# Patient Record
Sex: Female | Born: 1970 | Race: Black or African American | Hispanic: No | Smoking: Never smoker
Health system: Southern US, Community
[De-identification: ages and names within clinical notes are randomized; demographics above are authoritative.]

## PROBLEM LIST (undated history)

## (undated) DIAGNOSIS — J45909 Unspecified asthma, uncomplicated: Secondary | ICD-10-CM

## (undated) DIAGNOSIS — D649 Anemia, unspecified: Secondary | ICD-10-CM

## (undated) DIAGNOSIS — F319 Bipolar disorder, unspecified: Secondary | ICD-10-CM

## (undated) DIAGNOSIS — F32A Depression, unspecified: Secondary | ICD-10-CM

## (undated) DIAGNOSIS — F329 Major depressive disorder, single episode, unspecified: Secondary | ICD-10-CM

## (undated) DIAGNOSIS — K589 Irritable bowel syndrome without diarrhea: Secondary | ICD-10-CM

## (undated) DIAGNOSIS — B1920 Unspecified viral hepatitis C without hepatic coma: Secondary | ICD-10-CM

## (undated) DIAGNOSIS — K7460 Unspecified cirrhosis of liver: Secondary | ICD-10-CM

## (undated) DIAGNOSIS — I10 Essential (primary) hypertension: Secondary | ICD-10-CM

## (undated) HISTORY — PX: ABDOMINAL SURGERY: SHX537

## (undated) HISTORY — PX: DILATION AND CURETTAGE OF UTERUS: SHX78

## (undated) HISTORY — PX: TUBAL LIGATION: SHX77

## (undated) HISTORY — PX: BREAST SURGERY: SHX581

---

## 1997-08-19 ENCOUNTER — Inpatient Hospital Stay (HOSPITAL_COMMUNITY): Admission: AD | Admit: 1997-08-19 | Discharge: 1997-08-19 | Payer: Self-pay | Admitting: Obstetrics & Gynecology

## 1997-08-27 ENCOUNTER — Inpatient Hospital Stay (HOSPITAL_COMMUNITY): Admission: AD | Admit: 1997-08-27 | Discharge: 1997-08-29 | Payer: Self-pay | Admitting: Obstetrics and Gynecology

## 1998-09-29 ENCOUNTER — Emergency Department (HOSPITAL_COMMUNITY): Admission: EM | Admit: 1998-09-29 | Discharge: 1998-09-29 | Payer: Self-pay | Admitting: Emergency Medicine

## 1998-09-29 ENCOUNTER — Encounter: Payer: Self-pay | Admitting: Emergency Medicine

## 2000-09-05 ENCOUNTER — Encounter: Payer: Self-pay | Admitting: Emergency Medicine

## 2000-09-05 ENCOUNTER — Emergency Department (HOSPITAL_COMMUNITY): Admission: EM | Admit: 2000-09-05 | Discharge: 2000-09-05 | Payer: Self-pay | Admitting: Emergency Medicine

## 2001-02-23 ENCOUNTER — Emergency Department (HOSPITAL_COMMUNITY): Admission: EM | Admit: 2001-02-23 | Discharge: 2001-02-23 | Payer: Self-pay | Admitting: *Deleted

## 2001-03-03 ENCOUNTER — Ambulatory Visit (HOSPITAL_BASED_OUTPATIENT_CLINIC_OR_DEPARTMENT_OTHER): Admission: RE | Admit: 2001-03-03 | Discharge: 2001-03-03 | Payer: Self-pay | Admitting: General Surgery

## 2001-03-09 ENCOUNTER — Inpatient Hospital Stay (HOSPITAL_COMMUNITY): Admission: EM | Admit: 2001-03-09 | Discharge: 2001-03-10 | Payer: Self-pay | Admitting: Emergency Medicine

## 2002-07-31 ENCOUNTER — Emergency Department (HOSPITAL_COMMUNITY): Admission: EM | Admit: 2002-07-31 | Discharge: 2002-07-31 | Payer: Self-pay

## 2002-10-02 ENCOUNTER — Encounter: Payer: Self-pay | Admitting: Endocrinology

## 2002-10-02 ENCOUNTER — Encounter (HOSPITAL_COMMUNITY): Admission: RE | Admit: 2002-10-02 | Discharge: 2002-12-31 | Payer: Self-pay | Admitting: Endocrinology

## 2005-01-23 ENCOUNTER — Emergency Department (HOSPITAL_COMMUNITY): Admission: EM | Admit: 2005-01-23 | Discharge: 2005-01-23 | Payer: Self-pay | Admitting: Emergency Medicine

## 2005-01-23 ENCOUNTER — Inpatient Hospital Stay (HOSPITAL_COMMUNITY): Admission: EM | Admit: 2005-01-23 | Discharge: 2005-01-24 | Payer: Self-pay | Admitting: Emergency Medicine

## 2007-07-01 ENCOUNTER — Encounter: Admission: RE | Admit: 2007-07-01 | Discharge: 2007-07-01 | Payer: Self-pay | Admitting: General Practice

## 2010-11-28 NOTE — Op Note (Signed)
[place]  Patient:    Nancy Mckinney, Nancy Mckinney Visit Number: [id number] MRN: [id number]          Service Type: DSU Location: [facility] Attending Physician:  Cherylynn Ridges Proc. Date: 03/03/01 Adm. Date:  03/03/2001                             Operative Report  PREOPERATIVE DIAGNOSIS:  Perianal pain with internal and external hemorrhoids.  POSTOPERATIVE DIAGNOSES: 1. Anterior anal fissure. 2. Anteroanal polyps. 3. Posterior and lateral internal and external hemorrhoids.  SURGEON:  Jimmye Norman, M.D.  PROCEDURES: 1. Rigid sigmoidoscopy. 2. Examination under anesthesia. 3. Lateral sphincterotomy with excision of fissure with anterior fissurectomy    and internal and external hemorrhoidectomy x 2.  ANESTHESIA:  General with a laryngeal airway.  ESTIMATED BLOOD LOSS:  15-20 cc.  COMPLICATIONS:  None.  CONDITION:  Stable.  INDICATION FOR OPERATION:  The patient is a 40 year old female with severe perianal pain associated with hemorrhoids.  She now comes in for exam under anesthesia, sigmoidoscopy, and hemorrhoidectomy.  FINDINGS:  The patient had an anteroanal fissure associated with sentinel polyps.  This area was excised, cauterized, and removed.  She also had internal and external hemorrhoids at approximately the 7 oclock position and also the 4 oclock position.  All of these were removed.  DESCRIPTION OF PROCEDURE:  The patient was taken to the operating room and placed on the table in a supine position.  After an adequate general anesthetic was administered with a laryngeal airway, she was prepped in the usual sterile manner after a rigid sigmoidoscopy was performed.  We performed the rigid sigmoidoscopy prior to Betadine prep.  We were able to go in up to a maximum of 22 at the base of the sigmoidoscope.  There were no rectal polyps noted, no masses, and no areas of bleeding.  We could see evidence of internal hemorrhoids on retrieval;  however, we could not visualize the anterior fissure until the procedure was completed and anoscope was inserted.  A bi-beveled anoscope was passed into the rectum after prepping, and we were able to see the anterior fissure.  We incorporated the base of this fissure up internally using a 3-0 chromic, then subsequently excised it down to the muscle and through the external portion of the external sphincter or the submucosal portion of the external sphincter.  We reapproximated using a running 3-0 chromic locking 3-0 chromic.  We subsequently identified two hemorrhoidal complexes at the 7 and 4 oclock position.  The base of the internal hemorrhoids were sutured using a 3-0 chromic and then subsequently used this running 3-0 chromic to reapproximate the mucosa after we excised the internal and external hemorrhoids using a #15 blade and electrocautery.  Some hemostatic stitches had to be used of 3-0 chromic after excision of both these complexes, but once we had completed there was no bleeding by any account. Once the hemorrhoids were removed, we were able to place a Gelfoam patch with Dibucaine ointment internally.  All needle counts, sponge counts, and instrument counts were correct.  The sterile dressing was applied.  The patient was taken to the recovery room. Attending Physician:  Cherylynn Ridges DD:  03/03/01 TD:  03/04/01 Job: [id number]

## 2014-03-17 ENCOUNTER — Emergency Department (HOSPITAL_COMMUNITY): Payer: Self-pay

## 2014-03-17 ENCOUNTER — Encounter (HOSPITAL_COMMUNITY): Payer: Self-pay | Admitting: Emergency Medicine

## 2014-03-17 ENCOUNTER — Emergency Department (HOSPITAL_COMMUNITY)
Admission: EM | Admit: 2014-03-17 | Discharge: 2014-03-21 | Disposition: A | Discharge status: Home / Self Care | Payer: Self-pay | Attending: Emergency Medicine | Admitting: Emergency Medicine

## 2014-03-17 DIAGNOSIS — Z008 Encounter for other general examination: Secondary | ICD-10-CM | POA: Insufficient documentation

## 2014-03-17 DIAGNOSIS — F411 Generalized anxiety disorder: Secondary | ICD-10-CM | POA: Insufficient documentation

## 2014-03-17 DIAGNOSIS — Z3202 Encounter for pregnancy test, result negative: Secondary | ICD-10-CM | POA: Insufficient documentation

## 2014-03-17 DIAGNOSIS — F319 Bipolar disorder, unspecified: Secondary | ICD-10-CM | POA: Insufficient documentation

## 2014-03-17 DIAGNOSIS — F332 Major depressive disorder, recurrent severe without psychotic features: Secondary | ICD-10-CM | POA: Insufficient documentation

## 2014-03-17 DIAGNOSIS — Z79899 Other long term (current) drug therapy: Secondary | ICD-10-CM | POA: Insufficient documentation

## 2014-03-17 DIAGNOSIS — F331 Major depressive disorder, recurrent, moderate: Secondary | ICD-10-CM | POA: Diagnosis present

## 2014-03-17 DIAGNOSIS — R45851 Suicidal ideations: Secondary | ICD-10-CM | POA: Insufficient documentation

## 2014-03-17 DIAGNOSIS — J45909 Unspecified asthma, uncomplicated: Secondary | ICD-10-CM | POA: Insufficient documentation

## 2014-03-17 HISTORY — DX: Unspecified asthma, uncomplicated: J45.909

## 2014-03-17 HISTORY — DX: Bipolar disorder, unspecified: F31.9

## 2014-03-17 LAB — COMPREHENSIVE METABOLIC PANEL
ALT: 112 U/L — AB (ref 0–35)
AST: 116 U/L — ABNORMAL HIGH (ref 0–37)
Albumin: 3.9 g/dL (ref 3.5–5.2)
Alkaline Phosphatase: 93 U/L (ref 39–117)
Anion gap: 20 — ABNORMAL HIGH (ref 5–15)
BUN: 9 mg/dL (ref 6–23)
CALCIUM: 9.4 mg/dL (ref 8.4–10.5)
CHLORIDE: 104 meq/L (ref 96–112)
CO2: 20 meq/L (ref 19–32)
Creatinine, Ser: 0.73 mg/dL (ref 0.50–1.10)
GFR calc Af Amer: >90 mL/min (ref >90)
GLUCOSE: 116 mg/dL — AB (ref 70–99)
Potassium: 3.9 mEq/L (ref 3.7–5.3)
SODIUM: 144 meq/L (ref 137–147)
Total Bilirubin: 0.3 mg/dL (ref 0.3–1.2)
Total Protein: 8.5 g/dL — ABNORMAL HIGH (ref 6.0–8.3)

## 2014-03-17 LAB — CBC
HCT: 39.9 % (ref 36.0–46.0)
HEMOGLOBIN: 13.7 g/dL (ref 12.0–15.0)
MCH: 30.7 pg (ref 26.0–34.0)
MCHC: 34.3 g/dL (ref 30.0–36.0)
MCV: 89.5 fL (ref 78.0–100.0)
PLATELETS: 136 10*3/uL — AB (ref 150–400)
RBC: 4.46 MIL/uL (ref 3.87–5.11)
RDW: 12.8 % (ref 11.5–15.5)
WBC: 5.4 10*3/uL (ref 4.0–10.5)

## 2014-03-17 LAB — RAPID URINE DRUG SCREEN, HOSP PERFORMED
AMPHETAMINES: NOT DETECTED
BENZODIAZEPINES: NOT DETECTED
Barbiturates: NOT DETECTED
Cocaine: NOT DETECTED
Opiates: NOT DETECTED
TETRAHYDROCANNABINOL: NOT DETECTED

## 2014-03-17 LAB — SALICYLATE LEVEL

## 2014-03-17 LAB — POC URINE PREG, ED: PREG TEST UR: NEGATIVE

## 2014-03-17 LAB — ACETAMINOPHEN LEVEL: Acetaminophen (Tylenol), Serum: <15 ug/mL (ref 10–30)

## 2014-03-17 LAB — ETHANOL: Alcohol, Ethyl (B): 148 mg/dL — ABNORMAL HIGH (ref 0–11)

## 2014-03-17 MED ORDER — ALBUTEROL SULFATE HFA 108 (90 BASE) MCG/ACT IN AERS
2.0000 | INHALATION_SPRAY | Freq: Four times a day (QID) | RESPIRATORY_TRACT | Status: DC | PRN
  Administered 2014-03-17 – 2014-03-20 (×5): 2 via RESPIRATORY_TRACT
  Filled 2014-03-17: qty 6.7

## 2014-03-17 MED ORDER — SERTRALINE HCL 50 MG PO TABS
50.0000 mg | ORAL_TABLET | Freq: Every day | ORAL | Status: DC
  Administered 2014-03-17 – 2014-03-20 (×4): 50 mg via ORAL
  Filled 2014-03-17 (×4): qty 1

## 2014-03-17 MED ORDER — LORAZEPAM 1 MG PO TABS
1.0000 mg | ORAL_TABLET | Freq: Three times a day (TID) | ORAL | Status: DC | PRN
  Administered 2014-03-18 – 2014-03-21 (×3): 1 mg via ORAL
  Filled 2014-03-17 (×3): qty 1

## 2014-03-17 MED ORDER — ACETAMINOPHEN 325 MG PO TABS
650.0000 mg | ORAL_TABLET | ORAL | Status: DC | PRN
  Administered 2014-03-18: 650 mg via ORAL
  Filled 2014-03-17: qty 2

## 2014-03-17 MED ORDER — ZOLPIDEM TARTRATE 5 MG PO TABS
5.0000 mg | ORAL_TABLET | Freq: Every evening | ORAL | Status: DC | PRN
  Administered 2014-03-17 – 2014-03-18 (×2): 5 mg via ORAL
  Filled 2014-03-17 (×2): qty 1

## 2014-03-17 MED ORDER — ONDANSETRON HCL 4 MG PO TABS: 4.0000 mg | ORAL_TABLET | Freq: Three times a day (TID) | ORAL | Status: DC | PRN

## 2014-03-17 NOTE — ED Notes (Signed)
Pt c/o severe depression and suicidal ideation. Sts was hospitalized last year for same, and is seeing psychiatrist but she is "not honest with them and plays a role" . Pt sts 2 years ago her son killed himself and ever since she "is not right in her head".  Pt sts she "predicted my son's death and that is so fucked up. If I didn't say it he would still be alive". Pt sts she has been drinking this entire week, denies drugs use, sts she is ashamed of herself and is disappointment to her family. Pt sts she is suicidal and she would hung herself "just like my son did". Pt is crying in triage, ex husband is at bedside and is supportive of pt.

## 2014-03-17 NOTE — ED Provider Notes (Signed)
CSN: [id number]     Arrival date & time 03/17/14  1231 History   First MD Initiated Contact with Patient 03/17/14 1302     Chief Complaint  Patient presents with  . Suicidal     (Consider location/radiation/quality/duration/timing/severity/associated sxs/prior Treatment) HPI Nancy Mckinney is a 43 y.o. female who presents to ED with complaint of suicidal ideations. States she is severely depressed, and has been so since the death of her son 1 year ago. Pt currently seeing psychiatrist and going to therapy but states she has not been honest with anyone and has been putting of a front of "strong [name]" she states but adds "in really i am very very weak and "fucked" up." States she has a lot going in her head. States she has been not taking all of her psych medications because could not afford some, and has been taking up to 60mg  of prozac a day instead of her 10mg  dose. States in the last 5 days she has not taken any of her medications and has been drinking daily. Denies drugs. States constantly thinking about hurting herself, thinks she wants to hang herself "like my son did." Pt had prior commitment for same and hospitalization 1 year ago. Pt is here voluntarily. States she wants to get help for herself and her family.   Past Medical History  Diagnosis Date  . Asthma   . Bipolar 1 disorder    History reviewed. No pertinent past surgical history. No family history on file. History  Substance Use Topics  . Smoking status: Never Smoker   . Smokeless tobacco: Not on file  . Alcohol Use: Yes   OB History   Grav Para Term Preterm Abortions TAB SAB Ect Mult Living                 Review of Systems  Constitutional: Negative for fever and chills.  Respiratory: Negative for cough, chest tightness and shortness of breath.   Cardiovascular: Negative for chest pain, palpitations and leg swelling.  Gastrointestinal: Negative for nausea, vomiting, abdominal pain and diarrhea.  Musculoskeletal:  Negative for arthralgias and myalgias.  Skin: Negative for rash.  Neurological: Negative for dizziness, weakness and headaches.  Psychiatric/Behavioral: Positive for suicidal ideas. Negative for self-injury. The patient is nervous/anxious.   All other systems reviewed and are negative.     Allergies  Review of patient's allergies indicates no known allergies.  Home Medications   Prior to Admission medications   Medication Sig Start Date End Date Taking? Authorizing Provider  albuterol (PROVENTIL HFA;VENTOLIN HFA) 108 (90 BASE) MCG/ACT inhaler Inhale 1 puff into the lungs every 6 (six) hours as needed for wheezing or shortness of breath.   Yes Historical Provider, MD  FLUoxetine (PROZAC) 10 MG capsule Take 10 mg by mouth daily.   Yes Historical Provider, MD  Prenatal Vit-Fe Fumarate-FA (MULTIVITAMIN-PRENATAL) 27-0.8 MG TABS tablet Take 1 tablet by mouth daily at 12 noon.   Yes Historical Provider, MD  QUEtiapine (SEROQUEL XR) 300 MG 24 hr tablet Take 300 mg by mouth daily.   Yes Historical Provider, MD   BP 139/83  Pulse 98  Temp(Src) 98.7 F (37.1 C) (Oral)  Resp 18  Ht 5\' 5"  (1.651 m)  Wt 225 lb (102.059 kg)  BMI 37.44 kg/m2  SpO2 98%  LMP 03/03/2014 Physical Exam  Nursing note and vitals reviewed. Constitutional: She is oriented to person, place, and time. She appears well-developed and well-nourished. No distress.  HENT:  Head: Normocephalic.  Eyes: Conjunctivae  are normal.  Neck: Neck supple.  Cardiovascular: Normal rate, regular rhythm and normal heart sounds.   Pulmonary/Chest: Effort normal and breath sounds normal. No respiratory distress. She has no wheezes. She has no rales.  Abdominal: Soft. Bowel sounds are normal. She exhibits no distension. There is no tenderness. There is no rebound.  Musculoskeletal: She exhibits no edema.  Neurological: She is alert and oriented to person, place, and time.  Skin: Skin is warm and dry.  Psychiatric: Her speech is normal and  behavior is normal. Judgment normal. Her mood appears anxious. Cognition and memory are normal. She exhibits a depressed mood. She expresses suicidal ideation. She expresses suicidal plans.  Pt is very tearful through out entire history    ED Course  Procedures (including critical care time) Labs Review Labs Reviewed  CBC - Abnormal; Notable for the following:    Platelets 136 (*)    All other components within normal limits  COMPREHENSIVE METABOLIC PANEL - Abnormal; Notable for the following:    Glucose, Bld 116 (*)    Total Protein 8.5 (*)    AST 116 (*)    ALT 112 (*)    Anion gap 20 (*)    All other components within normal limits  ETHANOL - Abnormal; Notable for the following:    Alcohol, Ethyl (B) 148 (*)    All other components within normal limits  SALICYLATE LEVEL - Abnormal; Notable for the following:    Salicylate Lvl <1.5 (*)    All other components within normal limits  ACETAMINOPHEN LEVEL  URINE RAPID DRUG SCREEN (HOSP PERFORMED)  POC URINE PREG, ED    Imaging Review No results found.   EKG Interpretation None      MDM   Final diagnoses:  Major depressive disorder, recurrent, severe without psychotic features  Suicidal ideations    Pt with depression. Thoughts of SI with plan to hang herself. Will medically clear. Admit to psych. Pt is voluntarily here.   Filed Vitals:   03/18/14 2121 03/19/14 0551 03/19/14 1039 03/19/14 1257  BP: 159/86 126/73 159/65 136/90  Pulse: 66 73 70 66  Temp: 99.2 F (37.3 C) 98.2 F (36.8 C) 97.7 F (36.5 C) 99.1 F (37.3 C)  TempSrc: Oral Oral Oral Oral  Resp: 18 18 16 17   Height:      Weight:      SpO2: 97% 99% 100% 98%       Renold Genta, PA-C 03/19/14 1639

## 2014-03-17 NOTE — ED Notes (Signed)
Up to the bathroom 

## 2014-03-17 NOTE — ED Notes (Signed)
Pt to x ray via stretcher, mHt w/ pt

## 2014-03-17 NOTE — ED Notes (Signed)
Report given to [facility] in psych ED

## 2014-03-17 NOTE — Progress Notes (Signed)
The following facilities have been contacted regarding inptx bed availability:  REFERRAL FAXED: [facility]- per Amy low acuity adult beds [facility]- per [facility] can fax [facility]- per Lisabeth Pick can fax info [facility]- per Nicole Kindred having d/c's and can fax Sharlene Motts- per Roselie Awkward can fax [facility]- per Kenney Houseman can fax [facility]- per [facility] beds available  AT CAPACITY: [facility]- per Danny Lawless- per Vivi Ferns- per Rebecka Apley- per Geraldine Contras- per Evelena Peat [facility]- per [facility]- per Caryl Pina [facility]- per Kasandra Knudsen currently at [facility] Disposition [facility]

## 2014-03-17 NOTE — Consult Note (Signed)
[facility] Face-to-Face Psychiatry Consult   Reason for Consult:  Depression Referring Physician:  EDP  Nancy Mckinney is an 43 y.o. female. Total Time spent with patient: 20 minutes  Assessment: AXIS I:  Major Depression, Recurrent severe AXIS II:  Deferred AXIS III:   Past Medical History  Diagnosis Date  . Asthma   . Bipolar 1 disorder    AXIS IV:  economic problems, occupational problems, other psychosocial or environmental problems, problems related to social environment and problems with primary support group AXIS V:  21-30 behavior considerably influenced by delusions or hallucinations OR serious impairment in judgment, communication OR inability to function in almost all areas  Plan:  Recommend psychiatric Inpatient admission when medically cleared.  Subjective:   Nancy Mckinney is a 43 y.o. female patient admitted with depression, suicidal ideations and plan to hang herself.  HPI:  Patient is tearful, depressed with a plan to hang herself like her son did two years ago.  He was 14 yo and had been living with her.  She realized something was wrong with him but the place she took him cleared him.  Nancy Mckinney sent him to Nancy Mckinney with his father and his relatives to see if they could help him.  She went to visit and he kept saying he wanted to kill himself but she had discounted it.  He hung himself with tied baby blankets from his newborn son.  His brother found him and a note from him saying he was worthless.  Nancy Mckinney has huge guilt issues.  After his death, her husband divorced her and she lost her job.  For the past two years she has been trying to cope with no positive results.  She was going to Nancy Mckinney but states she was not open about how bad she was feeling.  Nancy Mckinney was prescribed Prozac 10 mg daily and Seroquel 300 mg at bedtime but stopped taking them a month ago, too expensive. Denies homicidal ideations, hallucinations, and drug abuse.  She has been drinking more this week but does not  feel it is a problem. HPI Elements:   Location:  generalized. Quality:  acute. Severity:  severe. Timing:  constant. Duration:  past month. Context:  stressors.  Past Psychiatric History: Past Medical History  Diagnosis Date  . Asthma   . Bipolar 1 disorder     reports that she has never smoked. She does not have any smokeless tobacco history on file. She reports that she drinks alcohol. She reports that she uses illicit drugs. History reviewed. No pertinent family history.         Allergies:  No Known Allergies  ACT Assessment Complete:  Yes:    Educational Status    Risk to Self: Risk to self with the past 6 months Is patient at risk for suicide?: Yes Substance abuse history and/or treatment for substance abuse?: Yes  Risk to Others:    Abuse:    Prior Inpatient Therapy:    Prior Outpatient Therapy:    Additional Information:                    Objective: Blood pressure 139/83, pulse 98, temperature 98.7 F (37.1 C), temperature source Oral, resp. rate 18, height _0  (1.651 m), weight 225 lb (102.059 kg), last menstrual period 03/03/2014, SpO2 98.00%.Body mass index is 37.44 kg/(m^2). Results for orders placed during the hospital encounter of 03/17/14 (from the past 72 hour(s))  URINE RAPID DRUG SCREEN (HOSP PERFORMED)     Status:  None   Collection Time    03/17/14  1:04 PM      Result Value Ref Range   Opiates NONE DETECTED  NONE DETECTED   Cocaine NONE DETECTED  NONE DETECTED   Benzodiazepines NONE DETECTED  NONE DETECTED   Amphetamines NONE DETECTED  NONE DETECTED   Tetrahydrocannabinol NONE DETECTED  NONE DETECTED   Barbiturates NONE DETECTED  NONE DETECTED   Comment:            DRUG SCREEN FOR MEDICAL PURPOSES     ONLY.  IF CONFIRMATION IS NEEDED     FOR ANY PURPOSE, NOTIFY LAB     WITHIN 5 DAYS.                LOWEST DETECTABLE LIMITS     FOR URINE DRUG SCREEN     Drug Class       Cutoff (ng/mL)     Amphetamine      1000      Barbiturate      200     Benzodiazepine   017     Tricyclics       494     Opiates          300     Cocaine          300     THC              50  ACETAMINOPHEN LEVEL     Status: None   Collection Time    03/17/14  1:07 PM      Result Value Ref Range   Acetaminophen (Tylenol), Serum <15.0  10 - 30 ug/mL   Comment:            THERAPEUTIC CONCENTRATIONS VARY     SIGNIFICANTLY. A RANGE OF 10-30     ug/mL MAY BE AN EFFECTIVE     CONCENTRATION FOR MANY PATIENTS.     HOWEVER, SOME ARE BEST TREATED     AT CONCENTRATIONS OUTSIDE THIS     RANGE.     ACETAMINOPHEN CONCENTRATIONS     >150 ug/mL AT 4 HOURS AFTER     INGESTION AND >50 ug/mL AT 12     HOURS AFTER INGESTION ARE     OFTEN ASSOCIATED WITH TOXIC     REACTIONS.  CBC     Status: Abnormal   Collection Time    03/17/14  1:07 PM      Result Value Ref Range   WBC 5.4  4.0 - 10.5 K/uL   RBC 4.46  3.87 - 5.11 MIL/uL   Hemoglobin 13.7  12.0 - 15.0 g/dL   HCT 39.9  36.0 - 46.0 %   MCV 89.5  78.0 - 100.0 fL   MCH 30.7  26.0 - 34.0 pg   MCHC 34.3  30.0 - 36.0 g/dL   RDW 12.8  11.5 - 15.5 %   Platelets 136 (*) 150 - 400 K/uL  COMPREHENSIVE METABOLIC PANEL     Status: Abnormal   Collection Time    03/17/14  1:07 PM      Result Value Ref Range   Sodium 144  137 - 147 mEq/L   Potassium 3.9  3.7 - 5.3 mEq/L   Chloride 104  96 - 112 mEq/L   CO2 20  19 - 32 mEq/L   Glucose, Bld 116 (*) 70 - 99 mg/dL   BUN 9  6 - 23 mg/dL   Creatinine, Ser 0.73  0.50 -  1.10 mg/dL   Calcium 9.4  8.4 - 10.5 mg/dL   Total Protein 8.5 (*) 6.0 - 8.3 g/dL   Albumin 3.9  3.5 - 5.2 g/dL   AST 116 (*) 0 - 37 U/L   ALT 112 (*) 0 - 35 U/L   Alkaline Phosphatase 93  39 - 117 U/L   Total Bilirubin 0.3  0.3 - 1.2 mg/dL   GFR calc non Af Amer >90  >90 mL/min   GFR calc Af Amer >90  >90 mL/min   Comment: (NOTE)     The eGFR has been calculated using the CKD EPI equation.     This calculation has not been validated in all clinical situations.     eGFR's  persistently <90 mL/min signify possible Chronic Kidney     Disease.   Anion gap 20 (*) 5 - 15  ETHANOL     Status: Abnormal   Collection Time    03/17/14  1:07 PM      Result Value Ref Range   Alcohol, Ethyl (B) 148 (*) 0 - 11 mg/dL   Comment:            LOWEST DETECTABLE LIMIT FOR     SERUM ALCOHOL IS 11 mg/dL     FOR MEDICAL PURPOSES ONLY  SALICYLATE LEVEL     Status: Abnormal   Collection Time    03/17/14  1:07 PM      Result Value Ref Range   Salicylate Lvl <1.6 (*) 2.8 - 20.0 mg/dL  POC URINE PREG, ED     Status: None   Collection Time    03/17/14  1:08 PM      Result Value Ref Range   Preg Test, Ur NEGATIVE  NEGATIVE   Comment:            THE SENSITIVITY OF THIS     METHODOLOGY IS >24 mIU/mL   Labs are reviewed and are pertinent for no medical issues.  Current Facility-Administered Medications  Medication Dose Route Frequency Provider Last Rate Last Dose  . acetaminophen (TYLENOL) tablet 650 mg  650 mg Oral Q4H PRN Tatyana A Kirichenko, PA-C      . LORazepam (ATIVAN) tablet 1 mg  1 mg Oral Q8H PRN Tatyana A Kirichenko, PA-C      . ondansetron (ZOFRAN) tablet 4 mg  4 mg Oral Q8H PRN Tatyana A Kirichenko, PA-C      . zolpidem (AMBIEN) tablet 5 mg  5 mg Oral QHS PRN Tatyana A Kirichenko, PA-C       Current Outpatient Prescriptions  Medication Sig Dispense Refill  . albuterol (PROVENTIL HFA;VENTOLIN HFA) 108 (90 BASE) MCG/ACT inhaler Inhale 1 puff into the lungs every 6 (six) hours as needed for wheezing or shortness of breath.      Marland Kitchen FLUoxetine (PROZAC) 10 MG capsule Take 10 mg by mouth daily.      . Prenatal Vit-Fe Fumarate-FA (MULTIVITAMIN-PRENATAL) 27-0.8 MG TABS tablet Take 1 tablet by mouth daily at 12 noon.      . QUEtiapine (SEROQUEL XR) 300 MG 24 hr tablet Take 300 mg by mouth daily.        Psychiatric Specialty Exam:     Blood pressure 139/83, pulse 98, temperature 98.7 F (37.1 C), temperature source Oral, resp. rate 18, height _0  (1.651 m), weight 225  lb (102.059 kg), last menstrual period 03/03/2014, SpO2 98.00%.Body mass index is 37.44 kg/(m^2).  General Appearance: Casual  Eye Contact::  Fair  Speech:  Normal  Rate  Volume:  Normal  Mood:  Anxious and Depressed  Affect:  Depressed  Thought Process:  Coherent  Orientation:  Full (Time, Place, and Person)  Thought Content:  Rumination  Suicidal Thoughts:  Yes.  with intent/plan  Homicidal Thoughts:  No  Memory:  Immediate;   Good Recent;   Good Remote;   Good  Judgement:  Impaired  Insight:  Fair  Psychomotor Activity:  Decreased  Concentration:  Fair  Recall:  [employer] of Knowledge:Fair  Language: Fair  Akathisia:  No  Handed:  Right  AIMS (if indicated):     Assets:  [place] Support  Sleep:      Musculoskeletal: Strength & Muscle Tone: within normal limits Gait & Station: normal Patient leans: N/A  Treatment Plan Summary: Daily contact with patient to assess and evaluate symptoms and progress in treatment Medication management; admit to inpatient psychiatry for stabilization  Waylan Boga, [place] 03/17/2014 2:14 PM  Pt seen and I agree with treatment and plan Levonne Spiller MD

## 2014-03-17 NOTE — ED Notes (Signed)
Dan Europe NP into see

## 2014-03-17 NOTE — ED Notes (Signed)
Pt's daughter Butch Penny took ALL of pt's belongings home (clothes, jewelry: earings, rings, bracelets). Pt's purse given to RN in the [facility]

## 2014-03-17 NOTE — ED Notes (Signed)
Back from xray

## 2014-03-17 NOTE — ED Notes (Signed)
PT ambulatory from triage, Theodoro Clock NP into see on arrival

## 2014-03-17 NOTE — ED Notes (Signed)
Waiting for pt to arrive from triage

## 2014-03-17 NOTE — ED Notes (Signed)
Pt. Requesting albuterol inhaler.

## 2014-03-17 NOTE — ED Notes (Signed)
Report received from [facility]. Pt. Alert and oriented in no distress denies SI, HI, AVH and pain. Will continue to monitor for safety. Pt. Instructed to come to me with problems or concerns. Q 15 minute checks continue.

## 2014-03-18 NOTE — Consult Note (Signed)
[facility] Face-to-Face Psychiatry Consult   Reason for Consult:  Depression Referring Physician:  EDP  Nancy Mckinney is an 43 y.o. female. Total Time spent with patient: 20 minutes  Assessment: AXIS I:  Major Depression, Recurrent severe AXIS II:  Deferred AXIS III:   Past Medical History  Diagnosis Date  . Asthma   . Bipolar 1 disorder    AXIS IV:  economic problems, occupational problems, other psychosocial or environmental problems, problems related to social environment and problems with primary support group AXIS V:  21-30 behavior considerably influenced by delusions or hallucinations OR serious impairment in judgment, communication OR inability to function in almost all areas  Plan:  Recommend psychiatric Inpatient admission when medically cleared.  Subjective:   Nancy Mckinney is a 43 y.o. female patient admitted with depression, suicidal ideations and plan to hang herself.  HPI:  Patient is tearful, depressed with a plan to hang herself like her son did two years ago.  He was 50 yo and had been living with her.  She realized something was wrong with him but the place she took him cleared him.  Nancy Mckinney sent him to [facility] with his father and his relatives to see if they could help him.  She went to visit and he kept saying he wanted to kill himself but she had discounted it.  He hung himself with tied baby blankets from his newborn son.  His brother found him and a note from him saying he was worthless.  Nancy Mckinney has huge guilt issues.  After his death, her husband divorced her and she lost her job.  For the past two years she has been trying to cope with no positive results.  She was going to [facility] but states she was not open about how bad she was feeling.  Nancy Mckinney was prescribed Prozac 10 mg daily and Seroquel 300 mg at bedtime but stopped taking them a month ago, too expensive. Denies homicidal ideations, hallucinations, and drug abuse.  She has been drinking more this week but does not  feel it is a problem.  Patient seen again today, 03/18/14. She is still depressed with plan to harm herself. Reiterates numerous losses--son, divorce, loss of job, homeless. Her only coping mechanism has been drinking. She is very hopeless and in need of inpatient treatment. HPI Elements:   Location:  generalized. Quality:  acute. Severity:  severe. Timing:  constant. Duration:  past month. Context:  stressors.  Past Psychiatric History: Past Medical History  Diagnosis Date  . Asthma   . Bipolar 1 disorder     reports that she has never smoked. She does not have any smokeless tobacco history on file. She reports that she drinks alcohol. She reports that she uses illicit drugs. History reviewed. No pertinent family history.         Allergies:  No Known Allergies  ACT Assessment Complete:  Yes:    Educational Status    Risk to Self: Risk to self with the past 6 months Is patient at risk for suicide?: Yes Substance abuse history and/or treatment for substance abuse?: No  Risk to Others:    Abuse:    Prior Inpatient Therapy:    Prior Outpatient Therapy:    Additional Information:                    Objective: Blood pressure 134/82, pulse 65, temperature 98.3 F (36.8 C), temperature source Oral, resp. rate 16, height 5' 5" (1.651 m), weight 225 lb (  102.059 kg), last menstrual period 03/03/2014, SpO2 98.00%.Body mass index is 37.44 kg/(m^2). Results for orders placed during the hospital encounter of 03/17/14 (from the past 72 hour(s))  URINE RAPID DRUG SCREEN (HOSP PERFORMED)     Status: None   Collection Time    03/17/14  1:04 PM      Result Value Ref Range   Opiates NONE DETECTED  NONE DETECTED   Cocaine NONE DETECTED  NONE DETECTED   Benzodiazepines NONE DETECTED  NONE DETECTED   Amphetamines NONE DETECTED  NONE DETECTED   Tetrahydrocannabinol NONE DETECTED  NONE DETECTED   Barbiturates NONE DETECTED  NONE DETECTED   Comment:            DRUG SCREEN FOR  MEDICAL PURPOSES     ONLY.  IF CONFIRMATION IS NEEDED     FOR ANY PURPOSE, NOTIFY LAB     WITHIN 5 DAYS.                LOWEST DETECTABLE LIMITS     FOR URINE DRUG SCREEN     Drug Class       Cutoff (ng/mL)     Amphetamine      1000     Barbiturate      200     Benzodiazepine   315     Tricyclics       400     Opiates          300     Cocaine          300     THC              50  ACETAMINOPHEN LEVEL     Status: None   Collection Time    03/17/14  1:07 PM      Result Value Ref Range   Acetaminophen (Tylenol), Serum <15.0  10 - 30 ug/mL   Comment:            THERAPEUTIC CONCENTRATIONS VARY     SIGNIFICANTLY. A RANGE OF 10-30     ug/mL MAY BE AN EFFECTIVE     CONCENTRATION FOR MANY PATIENTS.     HOWEVER, SOME ARE BEST TREATED     AT CONCENTRATIONS OUTSIDE THIS     RANGE.     ACETAMINOPHEN CONCENTRATIONS     >150 ug/mL AT 4 HOURS AFTER     INGESTION AND >50 ug/mL AT 12     HOURS AFTER INGESTION ARE     OFTEN ASSOCIATED WITH TOXIC     REACTIONS.  CBC     Status: Abnormal   Collection Time    03/17/14  1:07 PM      Result Value Ref Range   WBC 5.4  4.0 - 10.5 K/uL   RBC 4.46  3.87 - 5.11 MIL/uL   Hemoglobin 13.7  12.0 - 15.0 g/dL   HCT 39.9  36.0 - 46.0 %   MCV 89.5  78.0 - 100.0 fL   MCH 30.7  26.0 - 34.0 pg   MCHC 34.3  30.0 - 36.0 g/dL   RDW 12.8  11.5 - 15.5 %   Platelets 136 (*) 150 - 400 K/uL  COMPREHENSIVE METABOLIC PANEL     Status: Abnormal   Collection Time    03/17/14  1:07 PM      Result Value Ref Range   Sodium 144  137 - 147 mEq/L   Potassium 3.9  3.7 - 5.3 mEq/L   Chloride 104  96 - 112 mEq/L   CO2 20  19 - 32 mEq/L   Glucose, Bld 116 (*) 70 - 99 mg/dL   BUN 9  6 - 23 mg/dL   Creatinine, Ser 0.73  0.50 - 1.10 mg/dL   Calcium 9.4  8.4 - 10.5 mg/dL   Total Protein 8.5 (*) 6.0 - 8.3 g/dL   Albumin 3.9  3.5 - 5.2 g/dL   AST 116 (*) 0 - 37 U/L   ALT 112 (*) 0 - 35 U/L   Alkaline Phosphatase 93  39 - 117 U/L   Total Bilirubin 0.3  0.3 - 1.2 mg/dL    GFR calc non Af Amer >90  >90 mL/min   GFR calc Af Amer >90  >90 mL/min   Comment: (NOTE)     The eGFR has been calculated using the CKD EPI equation.     This calculation has not been validated in all clinical situations.     eGFR's persistently <90 mL/min signify possible Chronic Kidney     Disease.   Anion gap 20 (*) 5 - 15  ETHANOL     Status: Abnormal   Collection Time    03/17/14  1:07 PM      Result Value Ref Range   Alcohol, Ethyl (B) 148 (*) 0 - 11 mg/dL   Comment:            LOWEST DETECTABLE LIMIT FOR     SERUM ALCOHOL IS 11 mg/dL     FOR MEDICAL PURPOSES ONLY  SALICYLATE LEVEL     Status: Abnormal   Collection Time    03/17/14  1:07 PM      Result Value Ref Range   Salicylate Lvl <8.2 (*) 2.8 - 20.0 mg/dL  POC URINE PREG, ED     Status: None   Collection Time    03/17/14  1:08 PM      Result Value Ref Range   Preg Test, Ur NEGATIVE  NEGATIVE   Comment:            THE SENSITIVITY OF THIS     METHODOLOGY IS >24 mIU/mL   Labs are reviewed and are pertinent for no medical issues.  Current Facility-Administered Medications  Medication Dose Route Frequency Provider Last Rate Last Dose  . acetaminophen (TYLENOL) tablet 650 mg  650 mg Oral Q4H PRN Tatyana A Kirichenko, PA-C      . albuterol (PROVENTIL HFA;VENTOLIN HFA) 108 (90 BASE) MCG/ACT inhaler 2 puff  2 puff Inhalation Q6H PRN Johnna Acosta, MD   2 puff at 03/18/14 1019  . LORazepam (ATIVAN) tablet 1 mg  1 mg Oral Q8H PRN Tatyana A Kirichenko, PA-C      . ondansetron (ZOFRAN) tablet 4 mg  4 mg Oral Q8H PRN Tatyana A Kirichenko, PA-C      . sertraline (ZOLOFT) tablet 50 mg  50 mg Oral Daily Waylan Boga, NP   50 mg at 03/18/14 1014  . zolpidem (AMBIEN) tablet 5 mg  5 mg Oral QHS PRN Tatyana A Kirichenko, PA-C   5 mg at 03/17/14 2107   Current Outpatient Prescriptions  Medication Sig Dispense Refill  . albuterol (PROVENTIL HFA;VENTOLIN HFA) 108 (90 BASE) MCG/ACT inhaler Inhale 1 puff into the lungs every 6 (six)  hours as needed for wheezing or shortness of breath.      Marland Kitchen FLUoxetine (PROZAC) 10 MG capsule Take 10 mg by mouth daily.      . Prenatal Vit-Fe Fumarate-FA (MULTIVITAMIN-PRENATAL) 27-0.8 MG  TABS tablet Take 1 tablet by mouth daily at 12 noon.      . QUEtiapine (SEROQUEL XR) 300 MG 24 hr tablet Take 300 mg by mouth daily.        Psychiatric Specialty Exam:     Blood pressure 134/82, pulse 65, temperature 98.3 F (36.8 C), temperature source Oral, resp. rate 16, height 5' 5" (1.651 m), weight 225 lb (102.059 kg), last menstrual period 03/03/2014, SpO2 98.00%.Body mass index is 37.44 kg/(m^2).  General Appearance: Casual  Eye Contact::  Fair  Speech:  Normal Rate  Volume:  Normal  Mood:  Anxious and Depressed  Affect:  Depressed  Thought Process:  Coherent  Orientation:  Full (Time, Place, and Person)  Thought Content:  Rumination  Suicidal Thoughts:  Yes.  with intent/plan  Homicidal Thoughts:  No  Memory:  Immediate;   Good Recent;   Good Remote;   Good  Judgement:  Impaired  Insight:  Fair  Psychomotor Activity:  Decreased  Concentration:  Fair  Recall:  [employer] of Knowledge:Fair  Language: Fair  Akathisia:  No  Handed:  Right  AIMS (if indicated):     Assets:  Housing Physical Health Resilience Social Support  Sleep:      Musculoskeletal: Strength & Muscle Tone: within normal limits Gait & Station: normal Patient leans: N/A  Treatment Plan Summary: Daily contact with patient to assess and evaluate symptoms and progress in treatment Medication management; admit to inpatient psychiatry for stabilization  Levonne Spiller, MD 03/18/2014 11:38 AM

## 2014-03-18 NOTE — ED Notes (Signed)
On the phone 

## 2014-03-18 NOTE — Progress Notes (Signed)
Follow-up calls have been placed to the following facilities regarding inptx: [facility]- per Shirlean Mylar at [place] no answer [facility]- per Lisabeth Pick being reviewed by MD and will call once complete Sharl[facility] Motts- per Roselie Awkward will review and call TTS once review complete [facility]- per Kenney Houseman at [place]- per [place] at [place]  The following facilities have been contacted and are at capacity: [facility]- per Lindaann Slough- per June Le[facility]- per Vivi Ferns- per Cornelia Copa- per Jetty Peeks [facility]- per Keturah Shavers- per [facility]- per Drucie Ip- per Manuela Schwartz [facility]- per Rande Lawman- per [facility]- per [facility]  *pt has been declined at [facility] per [facility] Disposition MHT

## 2014-03-18 NOTE — ED Notes (Signed)
Patient presents depressed with blunted affect; denies any current thoughts of self harm but does endorse hopelessness and racing thoughts; denies HI/AVH. NAD

## 2014-03-18 NOTE — ED Notes (Signed)
Dr Harrington Challenger and shuvon NP into see

## 2014-03-18 NOTE — Progress Notes (Signed)
1410- received phone call from Aldine at [facility], states that pt has been added to their wait list and will call once bed becomes available.   Rick Duff Disposition [facility]

## 2014-03-18 NOTE — ED Notes (Signed)
Up to the bathroom 

## 2014-03-18 NOTE — ED Notes (Signed)
Pt c/o wheezing--BS w/ bilateral insp and exp wheezing all fields. Immediate relief w/ albuterol MDI-all fields CTA

## 2014-03-18 NOTE — BH Assessment (Signed)
Nancy Mckinney, [facility] at [facility], confirmed adult unit is currently at capacity. Contacted the following facilities for placement:  CLINICAL INFORMATION HAS BEEN FAXED AND IS UNDER REVIEW: [facility], per [facility], per [name]  AT CAPACITY: [name], per [name], per [name], per [name] per [name], per [name], per [name], per [name], per [name], per [name], per [name], per [name], per [employer], per [name], per [name]  MULTIPLE ATTEMPTS TO CONTACT WITH NO RESPONSE: [name]   [name], [place], [name] [phone]

## 2014-03-19 ENCOUNTER — Encounter (HOSPITAL_COMMUNITY): Payer: Self-pay | Admitting: Psychiatry

## 2014-03-19 MED ORDER — QUETIAPINE FUMARATE 300 MG PO TABS
300.0000 mg | ORAL_TABLET | Freq: Every day | ORAL | Status: DC
  Administered 2014-03-19 – 2014-03-20 (×2): 300 mg via ORAL
  Filled 2014-03-19 (×2): qty 1

## 2014-03-19 NOTE — ED Notes (Signed)
Report received from [facility]. Pt. Sleeping, respirations even and unlabored. Will continue to monitor for safety. Q 15 minute checks continue.

## 2014-03-19 NOTE — Consult Note (Signed)
[facility] Face-to-Face Psychiatry Consult   Reason for Consult:  Depression Referring Physician:  EDP  Nancy Mckinney is an 43 y.o. female. Total Time spent with patient: 20 minutes  Assessment: AXIS I:  Major Depression, Recurrent severe AXIS II:  Deferred AXIS III:   Past Medical History  Diagnosis Date  . Asthma   . Bipolar 1 disorder    AXIS IV:  economic problems, occupational problems, other psychosocial or environmental problems, problems related to social environment and problems with primary support group AXIS V:  21-30 behavior considerably influenced by delusions or hallucinations OR serious impairment in judgment, communication OR inability to function in almost all areas  Plan:  Recommend psychiatric Inpatient admission when medically cleared.  Dr. Darleene Cleaver assessed the patient and concurs with the plan.  Subjective:   Nancy Mckinney is a 43 y.o. female patient admitted with depression, suicidal ideations and plan to hang herself.  HPI:  Patient remains depressed with suicidal ideations.  She has not been able to sleep, even with the Ambien.  Nancy Mckinney.  Medication restarted. HPI Elements:   Location:  generalized. Quality:  acute. Severity:  severe. Timing:  constant. Duration:  past month. Context:  stressors.  Past Psychiatric History: Past Medical History  Diagnosis Date  . Asthma   . Bipolar 1 disorder     reports that she has never smoked. She does not have any smokeless tobacco history on file. She reports that she drinks alcohol. She reports that she uses illicit drugs. History reviewed. No pertinent family history.         Allergies:  No Known Allergies  ACT Assessment Complete:  Yes:    Educational Status    Risk to Self: Risk to self with the past 6 months Is patient at risk for suicide?: Yes Substance abuse history and/or treatment for substance  abuse?: No  Risk to Others:    Abuse:    Prior Inpatient Therapy:    Prior Outpatient Therapy:    Additional Information:                    Objective: Blood pressure 126/73, pulse 73, temperature 98.2 F (36.8 C), temperature source Oral, resp. rate 18, height $RemoveBe'5\' 5"'[id number]$  (1.651 m), weight 225 lb (102.059 kg), last menstrual period 03/03/2014, SpO2 99.00%.Body mass index is 37.44 kg/(m^2). Results for orders placed during the hospital encounter of 03/17/14 (from the past 72 hour(s))  URINE RAPID DRUG SCREEN (HOSP PERFORMED)     Status: None   Collection Time    03/17/14  1:04 PM      Result Value Ref Range   Opiates NONE DETECTED  NONE DETECTED   Cocaine NONE DETECTED  NONE DETECTED   Benzodiazepines NONE DETECTED  NONE DETECTED   Amphetamines NONE DETECTED  NONE DETECTED   Tetrahydrocannabinol NONE DETECTED  NONE DETECTED   Barbiturates NONE DETECTED  NONE DETECTED   Comment:            DRUG SCREEN FOR MEDICAL PURPOSES     ONLY.  IF CONFIRMATION IS NEEDED     FOR ANY PURPOSE, NOTIFY LAB     WITHIN 5 DAYS.                LOWEST DETECTABLE LIMITS     FOR URINE DRUG SCREEN     Drug Class       Cutoff (ng/mL)  Amphetamine      1000     Barbiturate      200     Benzodiazepine   952     Tricyclics       841     Opiates          300     Cocaine          300     THC              50  ACETAMINOPHEN LEVEL     Status: None   Collection Time    03/17/14  1:07 PM      Result Value Ref Range   Acetaminophen (Tylenol), Serum <15.0  10 - 30 ug/mL   Comment:            THERAPEUTIC CONCENTRATIONS VARY     SIGNIFICANTLY. A RANGE OF 10-30     ug/mL MAY BE AN EFFECTIVE     CONCENTRATION FOR MANY PATIENTS.     HOWEVER, SOME ARE BEST TREATED     AT CONCENTRATIONS OUTSIDE THIS     RANGE.     ACETAMINOPHEN CONCENTRATIONS     >150 ug/mL AT 4 HOURS AFTER     INGESTION AND >50 ug/mL AT 12     HOURS AFTER INGESTION ARE     OFTEN ASSOCIATED WITH TOXIC     REACTIONS.  CBC      Status: Abnormal   Collection Time    03/17/14  1:07 PM      Result Value Ref Range   WBC 5.4  4.0 - 10.5 K/uL   RBC 4.46  3.87 - 5.11 MIL/uL   Hemoglobin 13.7  12.0 - 15.0 g/dL   HCT 39.9  36.0 - 46.0 %   MCV 89.5  78.0 - 100.0 fL   MCH 30.7  26.0 - 34.0 pg   MCHC 34.3  30.0 - 36.0 g/dL   RDW 12.8  11.5 - 15.5 %   Platelets 136 (*) 150 - 400 K/uL  COMPREHENSIVE METABOLIC PANEL     Status: Abnormal   Collection Time    03/17/14  1:07 PM      Result Value Ref Range   Sodium 144  137 - 147 mEq/L   Potassium 3.9  3.7 - 5.3 mEq/L   Chloride 104  96 - 112 mEq/L   CO2 20  19 - 32 mEq/L   Glucose, Bld 116 (*) 70 - 99 mg/dL   BUN 9  6 - 23 mg/dL   Creatinine, Ser 0.73  0.50 - 1.10 mg/dL   Calcium 9.4  8.4 - 10.5 mg/dL   Total Protein 8.5 (*) 6.0 - 8.3 g/dL   Albumin 3.9  3.5 - 5.2 g/dL   AST 116 (*) 0 - 37 U/L   ALT 112 (*) 0 - 35 U/L   Alkaline Phosphatase 93  39 - 117 U/L   Total Bilirubin 0.3  0.3 - 1.2 mg/dL   GFR calc non Af Amer >90  >90 mL/min   GFR calc Af Amer >90  >90 mL/min   Comment: (NOTE)     The eGFR has been calculated using the CKD EPI equation.     This calculation has not been validated in all clinical situations.     eGFR's persistently <90 mL/min signify possible Chronic Kidney     Disease.   Anion gap 20 (*) 5 - 15  ETHANOL     Status: Abnormal   Collection  Time    03/17/14  1:07 PM      Result Value Ref Range   Alcohol, Ethyl (B) 148 (*) 0 - 11 mg/dL   Comment:            LOWEST DETECTABLE LIMIT FOR     SERUM ALCOHOL IS 11 mg/dL     FOR MEDICAL PURPOSES ONLY  SALICYLATE LEVEL     Status: Abnormal   Collection Time    03/17/14  1:07 PM      Result Value Ref Range   Salicylate Lvl <4.0 (*) 2.8 - 20.0 mg/dL  POC URINE PREG, ED     Status: None   Collection Time    03/17/14  1:08 PM      Result Value Ref Range   Preg Test, Ur NEGATIVE  NEGATIVE   Comment:            THE SENSITIVITY OF THIS     METHODOLOGY IS >24 mIU/mL   Labs are reviewed and  are pertinent for no medical issues.  Current Facility-Administered Medications  Medication Dose Route Frequency Provider Last Rate Last Dose  . acetaminophen (TYLENOL) tablet 650 mg  650 mg Oral Q4H PRN Tatyana A Kirichenko, PA-C   650 mg at 03/18/14 1222  . albuterol (PROVENTIL HFA;VENTOLIN HFA) 108 (90 BASE) MCG/ACT inhaler 2 puff  2 puff Inhalation Q6H PRN Johnna Acosta, MD   2 puff at 03/18/14 1625  . LORazepam (ATIVAN) tablet 1 mg  1 mg Oral Q8H PRN Tatyana A Kirichenko, PA-C   1 mg at 03/18/14 2117  . ondansetron (ZOFRAN) tablet 4 mg  4 mg Oral Q8H PRN Tatyana A Kirichenko, PA-C      . sertraline (ZOLOFT) tablet 50 mg  50 mg Oral Daily Waylan Boga, NP   50 mg at 03/18/14 1014  . zolpidem (AMBIEN) tablet 5 mg  5 mg Oral QHS PRN Tatyana A Kirichenko, PA-C   5 mg at 03/18/14 2117   Current Outpatient Prescriptions  Medication Sig Dispense Refill  . albuterol (PROVENTIL HFA;VENTOLIN HFA) 108 (90 BASE) MCG/ACT inhaler Inhale 1 puff into the lungs every 6 (six) hours as needed for wheezing or shortness of breath.      Marland Kitchen FLUoxetine (PROZAC) 10 MG capsule Take 10 mg by mouth daily.      . Prenatal Vit-Fe Fumarate-FA (MULTIVITAMIN-PRENATAL) 27-0.8 MG TABS tablet Take 1 tablet by mouth daily at 12 noon.      . QUEtiapine (SEROQUEL XR) 300 MG 24 hr tablet Take 300 mg by mouth daily.        Psychiatric Specialty Exam:     Blood pressure 126/73, pulse 73, temperature 98.2 F (36.8 C), temperature source Oral, resp. rate 18, height $RemoveBe'5\' 5"'FFfiMQWZo$  (1.651 m), weight 225 lb (102.059 kg), last menstrual period 03/03/2014, SpO2 99.00%.Body mass index is 37.44 kg/(m^2).  General Appearance: Casual  Eye Contact::  Fair  Speech:  Normal Rate  Volume:  Normal  Mood:  Anxious and Depressed  Affect:  Depressed  Thought Process:  Coherent  Orientation:  Full (Time, Place, and Person)  Thought Content:  Rumination  Suicidal Thoughts:  Yes.  with intent/plan  Homicidal Thoughts:  No  Memory:  Immediate;    Good Recent;   Good Remote;   Good  Judgement:  Impaired  Insight:  Fair  Psychomotor Activity:  Decreased  Concentration:  Fair  Recall:  [employer] of Knowledge:Fair  Language: Fair  Akathisia:  No  Handed:  Right  AIMS (if indicated):     Assets:  Housing Physical Health Resilience Social Support  Sleep:      Musculoskeletal: Strength & Muscle Tone: within normal limits Gait & Station: normal Patient leans: N/A  Treatment Plan Summary: Daily contact with patient to assess and evaluate symptoms and progress in treatment Medication management; admit to inpatient psychiatry for stabilization; Seroquel 300 mg at bedtime for sleep and mood stability--this has helped the patient in the past  Waylan Boga, [place] 03/19/2014 8:32 AM   Patient seen, evaluated and I agree with notes by Nurse Practitioner. Corena Pilgrim, MD

## 2014-03-19 NOTE — ED Notes (Signed)
Patient denies SI, HI, AVH at present. Reports racing thoughts. States feelings of anxiety 8/10, depression 10/10. Contracts for safety on the unit. Reports taking Seroquel at home at 1900.   Encouragement offered. Given Seroquel.   Q 15 safety checks continue.

## 2014-03-19 NOTE — Progress Notes (Signed)
Writer faxed referrals to the following facilities with open beds: [facility], per [facility] fax [employer], per [place] [phone] Fax [facility], per Abigail Butts [phone] Fax [facility], per [facility] [address] per [place] [phone] Fax [facility], per [facility]    The following facilities are at capacity: [employer], per [facility], [facility], per [facility], per [facility], per [facility], per [facility], per [facility], per [facility], [facility], per [facility], per Linus Orn    No answer [facility]

## 2014-03-19 NOTE — Progress Notes (Signed)
  CARE MANAGEMENT ED NOTE 03/19/2014  Patient:  [facility]   Account Number:  [id number]  Date Initiated:  03/19/2014  Documentation initiated by:  Jackelyn Poling  Subjective/Objective Assessment:   43 yr old self pay [place] pt c/o severe depression and SI, son killed himself 2 yrs ago " dx Major Depression, Recurrent severe     Subjective/Objective Assessment Detail:   no pcp listed for pt & she confirms no pcp  Pt tells Cm she lost her "son and husband and I don't have a place to live"  Pt pleasant and cooperative  last [facility] admission was at [facility] on 01/23/2005  RN notes indicates pt has a daughter Butch Penny on 03/17/14     Action/Plan:   ED CM spoke with pt about [facility], IRC, urban ministries, DSS, Family services of the [place], discounted [place] providers  see note below   Action/Plan Detail:   Anticipated DC Date:       Status Recommendation to Physician:   Result of Recommendation:    Other ED [place]  Other  PCP issues  Outpatient Services - Pt will follow up  [facility] / [facility] (established/new)    Choice offered to / List presented to:            Status of service:  Completed, signed off  ED Comments:   ED Comments Detail:  CM spoke with pt who confirms self pay [employer] resident with no pcp. CM discussed and provided written information for self pay pcps, importance of pcp for f/u care, www.needymeds.org, discounted pharmacies and other [employer] such as [employer], affordable care act,  financial assistance, DSS and  health department Reviewed resources for [employer] self pay pcps like Jinny Blossom, family medicine at [address], [facility] family practice, general medical clinics, [facility] urgent care plus others, medication resources, [facility] out patient pharmacies and housing Pt voiced understanding and appreciation of resources provided  Provided [facility] contact information

## 2014-03-19 NOTE — BH Assessment (Signed)
Per Lavell Luster, [employer] at [facility], adult unit is currently at capacity. Contacted the following facilities for placement:  CURRENTLY AT CAPACITY: [facility], per [facility], per Wandra Feinstein, per [facility], per [facility], per Evert Kohl, per [facility], per [facility], Per [facility], per [facility], per [facility], per [facility], per [facility], per [facility], per Gilberto Better, per [facility], per [place]  INFORMATION HAS BEEN SENT TO THESE FACILITIES FOR WAIT LIST: [place] [facility]  PT DECLINED: [facility]   [place], [place], [facility] Triage Specialist [phone]

## 2014-03-20 MED ORDER — SERTRALINE HCL 50 MG PO TABS
100.0000 mg | ORAL_TABLET | Freq: Every day | ORAL | Status: DC
  Administered 2014-03-21: 100 mg via ORAL
  Filled 2014-03-20: qty 2

## 2014-03-20 NOTE — Consult Note (Signed)
[facility] Face-to-Face Psychiatry Consult   Reason for Consult:  Depression, passive suicidal thoughts Referring Physician:  EDP  Nancy Mckinney is an 43 y.o. female. Total Time spent with patient: 20 minutes  Assessment: AXIS I:  Major Depression, Recurrent severe AXIS II:  Deferred AXIS III:   Past Medical History  Diagnosis Date  . Asthma   . Bipolar 1 disorder    AXIS IV:  economic problems, occupational problems, other psychosocial or environmental problems, problems related to social environment and problems with primary support group AXIS V:  21-30 behavior considerably influenced by delusions or hallucinations OR serious impairment in judgment, communication OR inability to function in almost all areas  Plan:  Recommend psychiatric Inpatient admission when medically cleared. Patient is  assessed and qualifies for admission.  Subjective:   Nancy Mckinney is a 43 y.o. female patient admitted with depression, suicidal ideations and plan to hang herself.  HPI:  Patient remains depressed , anxious and suicidal ideations.   She has rated her anxiety as 8/10 and depression as 10/10. She slept just a little better on Seroquel last night. Patient continues to report negative thoughts and passive suicidal thoughts. She denies homicidal ideation or auditory/visual hallucinations.   HPI Elements:   Location:  Depression, anxiety and suicidal thoughts Quality:  acute. Severity:  severe. Timing:  constant. Duration:  past month. Context:  stressors.  Past Psychiatric History: Past Medical History  Diagnosis Date  . Asthma   . Bipolar 1 disorder     reports that she has never smoked. She does not have any smokeless tobacco history on file. She reports that she drinks alcohol. She reports that she uses illicit drugs. History reviewed. No pertinent family history.         Allergies:  No Known Allergies  ACT Assessment Complete:  Yes:    Educational Status    Risk to Self: Risk  to self with the past 6 months Is patient at risk for suicide?: Yes Substance abuse history and/or treatment for substance abuse?: No  Risk to Others:    Abuse:    Prior Inpatient Therapy:    Prior Outpatient Therapy:    Additional Information:                    Objective: Blood pressure 120/71, pulse 68, temperature 98 F (36.7 C), temperature source Oral, resp. rate 18, height $RemoveBe'5\' 5"'[id number]$  (1.651 m), weight 102.059 kg (225 lb), last menstrual period 03/03/2014, SpO2 100.00%.Body mass index is 37.44 kg/(m^2). Results for orders placed during the hospital encounter of 03/17/14 (from the past 72 hour(s))  URINE RAPID DRUG SCREEN (HOSP PERFORMED)     Status: None   Collection Time    03/17/14  1:04 PM      Result Value Ref Range   Opiates NONE DETECTED  NONE DETECTED   Cocaine NONE DETECTED  NONE DETECTED   Benzodiazepines NONE DETECTED  NONE DETECTED   Amphetamines NONE DETECTED  NONE DETECTED   Tetrahydrocannabinol NONE DETECTED  NONE DETECTED   Barbiturates NONE DETECTED  NONE DETECTED   Comment:            DRUG SCREEN FOR MEDICAL PURPOSES     ONLY.  IF CONFIRMATION IS NEEDED     FOR ANY PURPOSE, NOTIFY LAB     WITHIN 5 DAYS.                LOWEST DETECTABLE LIMITS     FOR URINE DRUG SCREEN  Drug Class       Cutoff (ng/mL)     Amphetamine      1000     Barbiturate      200     Benzodiazepine   250     Tricyclics       539     Opiates          300     Cocaine          300     THC              50  ACETAMINOPHEN LEVEL     Status: None   Collection Time    03/17/14  1:07 PM      Result Value Ref Range   Acetaminophen (Tylenol), Serum <15.0  10 - 30 ug/mL   Comment:            THERAPEUTIC CONCENTRATIONS VARY     SIGNIFICANTLY. A RANGE OF 10-30     ug/mL MAY BE AN EFFECTIVE     CONCENTRATION FOR MANY PATIENTS.     HOWEVER, SOME ARE BEST TREATED     AT CONCENTRATIONS OUTSIDE THIS     RANGE.     ACETAMINOPHEN CONCENTRATIONS     >150 ug/mL AT 4 HOURS AFTER      INGESTION AND >50 ug/mL AT 12     HOURS AFTER INGESTION ARE     OFTEN ASSOCIATED WITH TOXIC     REACTIONS.  CBC     Status: Abnormal   Collection Time    03/17/14  1:07 PM      Result Value Ref Range   WBC 5.4  4.0 - 10.5 K/uL   RBC 4.46  3.87 - 5.11 MIL/uL   Hemoglobin 13.7  12.0 - 15.0 g/dL   HCT 39.9  36.0 - 46.0 %   MCV 89.5  78.0 - 100.0 fL   MCH 30.7  26.0 - 34.0 pg   MCHC 34.3  30.0 - 36.0 g/dL   RDW 12.8  11.5 - 15.5 %   Platelets 136 (*) 150 - 400 K/uL  COMPREHENSIVE METABOLIC PANEL     Status: Abnormal   Collection Time    03/17/14  1:07 PM      Result Value Ref Range   Sodium 144  137 - 147 mEq/L   Potassium 3.9  3.7 - 5.3 mEq/L   Chloride 104  96 - 112 mEq/L   CO2 20  19 - 32 mEq/L   Glucose, Bld 116 (*) 70 - 99 mg/dL   BUN 9  6 - 23 mg/dL   Creatinine, Ser 0.73  0.50 - 1.10 mg/dL   Calcium 9.4  8.4 - 10.5 mg/dL   Total Protein 8.5 (*) 6.0 - 8.3 g/dL   Albumin 3.9  3.5 - 5.2 g/dL   AST 116 (*) 0 - 37 U/L   ALT 112 (*) 0 - 35 U/L   Alkaline Phosphatase 93  39 - 117 U/L   Total Bilirubin 0.3  0.3 - 1.2 mg/dL   GFR calc non Af Amer >90  >90 mL/min   GFR calc Af Amer >90  >90 mL/min   Comment: (NOTE)     The eGFR has been calculated using the CKD EPI equation.     This calculation has not been validated in all clinical situations.     eGFR's persistently <90 mL/min signify possible Chronic Kidney     Disease.   Anion gap 20 (*)  5 - 15  ETHANOL     Status: Abnormal   Collection Time    03/17/14  1:07 PM      Result Value Ref Range   Alcohol, Ethyl (B) 148 (*) 0 - 11 mg/dL   Comment:            LOWEST DETECTABLE LIMIT FOR     SERUM ALCOHOL IS 11 mg/dL     FOR MEDICAL PURPOSES ONLY  SALICYLATE LEVEL     Status: Abnormal   Collection Time    03/17/14  1:07 PM      Result Value Ref Range   Salicylate Lvl <8.2 (*) 2.8 - 20.0 mg/dL  POC URINE PREG, ED     Status: None   Collection Time    03/17/14  1:08 PM      Result Value Ref Range   Preg Test, Ur  NEGATIVE  NEGATIVE   Comment:            THE SENSITIVITY OF THIS     METHODOLOGY IS >24 mIU/mL   Labs are reviewed and are pertinent for no medical issues.  Current Facility-Administered Medications  Medication Dose Route Frequency Provider Last Rate Last Dose  . acetaminophen (TYLENOL) tablet 650 mg  650 mg Oral Q4H PRN Tatyana A Kirichenko, PA-C   650 mg at 03/18/14 1222  . albuterol (PROVENTIL HFA;VENTOLIN HFA) 108 (90 BASE) MCG/ACT inhaler 2 puff  2 puff Inhalation Q6H PRN Johnna Acosta, MD   2 puff at 03/19/14 2143  . LORazepam (ATIVAN) tablet 1 mg  1 mg Oral Q8H PRN Tatyana A Kirichenko, PA-C   1 mg at 03/18/14 2117  . ondansetron (ZOFRAN) tablet 4 mg  4 mg Oral Q8H PRN Tatyana A Kirichenko, PA-C      . QUEtiapine (SEROQUEL) tablet 300 mg  300 mg Oral QHS Waylan Boga, NP   300 mg at 03/19/14 1948  . sertraline (ZOLOFT) tablet 50 mg  50 mg Oral Daily Waylan Boga, NP   50 mg at 03/20/14 8003   Current Outpatient Prescriptions  Medication Sig Dispense Refill  . albuterol (PROVENTIL HFA;VENTOLIN HFA) 108 (90 BASE) MCG/ACT inhaler Inhale 1 puff into the lungs every 6 (six) hours as needed for wheezing or shortness of breath.      Marland Kitchen FLUoxetine (PROZAC) 10 MG capsule Take 10 mg by mouth daily.      . Prenatal Vit-Fe Fumarate-FA (MULTIVITAMIN-PRENATAL) 27-0.8 MG TABS tablet Take 1 tablet by mouth daily at 12 noon.      . QUEtiapine (SEROQUEL XR) 300 MG 24 hr tablet Take 300 mg by mouth daily.        Psychiatric Specialty Exam:     Blood pressure 120/71, pulse 68, temperature 98 F (36.7 C), temperature source Oral, resp. rate 18, height $RemoveBe'5\' 5"'tGDAGELQx$  (1.651 m), weight 102.059 kg (225 lb), last menstrual period 03/03/2014, SpO2 100.00%.Body mass index is 37.44 kg/(m^2).  General Appearance: Casual  Eye Contact::  Fair  Speech:  Normal Rate  Volume:  Normal  Mood:  Anxious and Depressed  Affect:  Depressed  Thought Process:  Coherent  Orientation:  Full (Time, Place, and Person)  Thought  Content:  Rumination  Suicidal Thoughts:  Yes.  with intent/plan  Homicidal Thoughts:  No  Memory:  Immediate;   Good Recent;   Good Remote;   Good  Judgement:  Impaired  Insight:  Fair  Psychomotor Activity:  Decreased  Concentration:  Fair  Recall:  [employer] of  Knowledge:Fair  Language: Fair  Akathisia:  No  Handed:  Right  AIMS (if indicated):     Assets:  Housing Physical Health Resilience Social Support  Sleep:      Musculoskeletal: Strength & Muscle Tone: within normal limits Gait & Station: normal Patient leans: N/A  Treatment Plan Summary: Daily contact with patient to assess and evaluate symptoms and progress in treatment Medication management;  Patient awaiting admission  to inpatient psychiatry for stabilization; Continue  Seroquel 300 mg at bedtime for sleep and mood stability--this has helped the patient in the past Increase Zoloft to $RemoveB'100mg'meyLgVmY$  daily for anxiety and depression.  Corena Pilgrim, MD 03/20/2014 9:14 AM

## 2014-03-20 NOTE — BHH Counselor (Signed)
Per Roderic Palau at [facility], they have beds. Writer faxed referral.  Arnold Long, [place] Assessment Counselor

## 2014-03-20 NOTE — ED Provider Notes (Signed)
Medical screening examination/treatment/procedure(s) were performed by non-physician practitioner and as supervising physician I was immediately available for consultation/collaboration.  Ernestina Patches, MD 03/20/14 1018

## 2014-03-20 NOTE — ED Notes (Signed)
Pt sleeping at present, will monitor for safety.

## 2014-03-20 NOTE — ED Notes (Signed)
Patient anxious, rates anxiety 10/10. States that her thoughts are racing. Endorses SI, "If I wasn't here I would try to do something, but there's nothing here I can do".   Encouragement offered. Given Ativan, Seroquel.  Q 15 safety checks continue.

## 2014-03-21 DIAGNOSIS — F411 Generalized anxiety disorder: Secondary | ICD-10-CM

## 2014-03-21 DIAGNOSIS — F331 Major depressive disorder, recurrent, moderate: Secondary | ICD-10-CM | POA: Diagnosis present

## 2014-03-21 MED ORDER — QUETIAPINE FUMARATE 300 MG PO TABS: 300.0000 mg | ORAL_TABLET | Freq: Every day | ORAL | Status: DC

## 2014-03-21 MED ORDER — SERTRALINE HCL 100 MG PO TABS: 100.0000 mg | ORAL_TABLET | Freq: Every day | ORAL | Status: DC

## 2014-03-21 NOTE — Consult Note (Signed)
[facility] Face-to-Face Psychiatry Consult   Reason for Consult: Increased anxiety  Referring Physician:  EDP  Nancy Mckinney is an 43 y.o. female. Total Time spent with patient: 20 minutes  Assessment: AXIS I:  Major Depression, Recurrent severe               Anxiety disorder, NOS AXIS II:  Deferred AXIS III:   Past Medical History  Diagnosis Date  . Asthma    AXIS IV:  economic problems, occupational problems, other psychosocial or environmental problems, problems related to social environment and problems with primary support group AXIS V:  61-70 mild symptoms  Plan:  No evidence of imminent risk to self or others at present.   Patient does not meet criteria for psychiatric inpatient admission. Discussed crisis plan, support from social network, calling 911, coming to the Emergency Department, and calling Suicide Hotline. Patient will be referred to [facility] for medication management and IRC  due to being homeless Patient is  assessed and qualifies for admission.  Subjective:   Nancy Mckinney is a 43 y.o. female patient admitted with depression but now reporting increasing anxiety.  HPI:  Patient seen and chart reviewed. She reports decreased depressive symptoms, but increased anxiety and worries due to being homeless. She denies psychosis, delusions, suicidal/homicidal ideations, intent or plan. She also reports decreased racing thoughts and states that she has been sleeping much better since she started taking Seroquel at bedtime.    HPI Elements:   Location:  Depression, anxiety  Quality:  mild Timing:  variable Duration:  past month. Context:  stressors.  Past Psychiatric History: Past Medical History  Diagnosis Date  . Asthma     reports that she has never smoked. She does not have any smokeless tobacco history on file. She reports that she drinks alcohol. She reports that she uses illicit drugs. History reviewed. No pertinent family history.         Allergies:  No  Known Allergies  ACT Assessment Complete:  Yes:    Educational Status    Risk to Self: Risk to self with the past 6 months Is patient at risk for suicide?: No Substance abuse history and/or treatment for substance abuse?: No  Risk to Others:    Abuse:    Prior Inpatient Therapy:    Prior Outpatient Therapy:    Additional Information:               Objective: Blood pressure 127/74, pulse 86, temperature 98 F (36.7 C), temperature source Oral, resp. rate 16, height 5\' 5"  (1.651 m), weight 102.059 kg (225 lb), last menstrual period 03/03/2014, SpO2 100.00%.Body mass index is 37.44 kg/(m^2). No results found for this or any previous visit (from the past 72 hour(s)). Labs are reviewed and are pertinent for no medical issues.  Current Facility-Administered Medications  Medication Dose Route Frequency Provider Last Rate Last Dose  . acetaminophen (TYLENOL) tablet 650 mg  650 mg Oral Q4H PRN Tatyana A Kirichenko, PA-C   650 mg at 03/18/14 1222  . albuterol (PROVENTIL HFA;VENTOLIN HFA) 108 (90 BASE) MCG/ACT inhaler 2 puff  2 puff Inhalation Q6H PRN Johnna Acosta, MD   2 puff at 03/20/14 1920  . LORazepam (ATIVAN) tablet 1 mg  1 mg Oral Q8H PRN Tatyana A Kirichenko, PA-C   1 mg at 03/21/14 0914  . ondansetron (ZOFRAN) tablet 4 mg  4 mg Oral Q8H PRN Tatyana A Kirichenko, PA-C      . QUEtiapine (SEROQUEL) tablet 300  mg  300 mg Oral QHS Waylan Boga, NP   300 mg at 03/20/14 1920  . sertraline (ZOLOFT) tablet 100 mg  100 mg Oral Daily Lidia Clavijo   100 mg at 03/21/14 5750   Current Outpatient Prescriptions  Medication Sig Dispense Refill  . albuterol (PROVENTIL HFA;VENTOLIN HFA) 108 (90 BASE) MCG/ACT inhaler Inhale 1 puff into the lungs every 6 (six) hours as needed for wheezing or shortness of breath.      Marland Kitchen FLUoxetine (PROZAC) 10 MG capsule Take 10 mg by mouth daily.      . Prenatal Vit-Fe Fumarate-FA (MULTIVITAMIN-PRENATAL) 27-0.8 MG TABS tablet Take 1 tablet by mouth daily at 12  noon.      . QUEtiapine (SEROQUEL XR) 300 MG 24 hr tablet Take 300 mg by mouth daily.        Psychiatric Specialty Exam:     Blood pressure 127/74, pulse 86, temperature 98 F (36.7 C), temperature source Oral, resp. rate 16, height 5\' 5"  (1.651 m), weight 102.059 kg (225 lb), last menstrual period 03/03/2014, SpO2 100.00%.Body mass index is 37.44 kg/(m^2).  General Appearance: fairly groomed  Engineer, water::  Fair  Speech:  Normal Rate  Volume:  Normal  Mood:  Anxious   Affect:  Neutral  Thought Process:  Coherent  Orientation:  Full (Time, Place, and Person)  Thought Content:  Rumination  Suicidal Thoughts:  denies  Homicidal Thoughts:  No  Memory:  Immediate;   Good Recent;   Good Remote;   Good  Judgement:  Impaired  Insight:  Fair  Psychomotor Activity: normal  Concentration:  Fair  Recall:  [employer] of Knowledge:Fair  Language: Fair  Akathisia:  No  Handed:  Right  AIMS (if indicated):     Assets:  Housing Physical Health Resilience Social Support  Sleep:    good   Musculoskeletal: Strength & Muscle Tone: within normal limits Gait & Station: normal Patient leans: N/A  Treatment Plan Summary: Daily contact with patient to assess and evaluate symptoms and progress in treatment Medication management;  Patient no longer meet criteria for admission; She will be discharged to [facility] for outpatient medication management and referred to [facility] due to being homelss. Continue  Seroquel 300 mg at bedtime for sleep and mood stability--this has helped the patient in the past Continue Zoloft to 100mg  daily for anxiety and depression.  Corena Pilgrim, MD 03/21/2014 9:39 AM

## 2014-03-21 NOTE — BHH Suicide Risk Assessment (Signed)
   Demographic Factors:  Low socioeconomic status, Unemployed and homeless  Total Time spent with patient: 20 minutes  Psychiatric Specialty Exam: Physical Exam  Psychiatric: Her speech is normal and behavior is normal. Judgment and thought content normal. Her mood appears anxious. Cognition and memory are normal.    Review of Systems  Constitutional: Negative.   HENT: Negative.   Eyes: Negative.   Respiratory: Negative.   Cardiovascular: Negative.   Gastrointestinal: Negative.   Genitourinary: Negative.   Musculoskeletal: Negative.   Skin: Negative.   Neurological: Negative.   Endo/Heme/Allergies: Negative.   Psychiatric/Behavioral: Negative.     Blood pressure 102/66, pulse 100, temperature 97.9 F (36.6 C), temperature source Oral, resp. rate 12, height 5\' 5"  (1.651 m), weight 102.059 kg (225 lb), last menstrual period 03/03/2014, SpO2 96.00%.Body mass index is 37.44 kg/(m^2).  General Appearance: Casual  Eye Contact::  Minimal  Speech:  Clear and Coherent  Volume:  Normal  Mood:  Euthymic  Affect:  anxious  Thought Process:  Goal Directed  Orientation:  Full (Time, Place, and Person)  Thought Content:  Negative  Suicidal Thoughts:  No  Homicidal Thoughts:  No  Memory:  Immediate;   Fair Recent;   Fair Remote;   Fair  Judgement:  Fair  Insight:  Fair  Psychomotor Activity:  Normal  Concentration:  Fair  Recall:  [employer] of [place]  Language: Fair  Akathisia:  No  Handed:  Right  AIMS (if indicated):     Assets:  Communication Skills Physical Health  Sleep:   good    Musculoskeletal: Strength & Muscle Tone: within normal limits Gait & Station: normal Patient leans: N/A   Mental Status Per Nursing Assessment::   On Admission:     Current Mental Status by Physician: patient denies suicidal ideation, intent or plan  Loss Factors: Financial problems/change in socioeconomic status  Historical Factors: NA  Risk Reduction Factors:   Sense  of responsibility to family  Continued Clinical Symptoms:  Resolving anxiety and depressive symptoms  Cognitive Features That Contribute To Risk:  Closed-mindedness    Suicide Risk:  Minimal: No identifiable suicidal ideation.  Patients presenting with no risk factors but with morbid ruminations; may be classified as minimal risk based on the severity of the depressive symptoms  Discharge Diagnoses:   AXIS I:  Major Depression, Recurrent severe AXIS II:  Deferred AXIS III:   Past Medical History  Diagnosis Date  . Asthma   . Bipolar 1 disorder    AXIS IV:  other psychosocial or environmental problems and problems related to social environment AXIS V:  61-70 mild symptoms  Plan Of Care/Follow-up recommendations:  Activity:  as tolerated Diet:  healthy  Is patient on multiple antipsychotic therapies at discharge:  No   Has Patient had three or more failed trials of antipsychotic monotherapy by history:  No  Recommended Plan for Multiple Antipsychotic Therapies: NA    Corena Pilgrim, MD 03/21/2014, 10:57 AM

## 2014-03-21 NOTE — ED Notes (Signed)
Pt provided with psychoeducational materials and resources.

## 2014-03-21 NOTE — Discharge Instructions (Addendum)
Trastorno Tour manager (Major Depressive Disorder) El trastorno depresivo mayor es una enfermedad mental. Tambin se llamado depresin clnica o depresin unipolar. Produce sentimientos de tristeza, desesperanza o desamparo. Algunas personas con trastorno depresivo mayor no se sienten particularmente tristes, pero pierden Librarian, academic las cosas que solan disfrutar (anhedonia). Tambin puede causar sntomas fsicos. Interfiere en el trabajo, la escuela, las relaciones y otras actividades diarias normales. Puede variar en gravedad, pero es ms duradera y ms grave que la tristeza que todos sentimos de vez en cuando en nuestras vidas.  Muchas veces es desencadenada por sucesos estresantes o cambios importantes en la vida. Algunos ejemplos de estos factores desencadenantes son el divorcio, la prdida del trabajo o el hogar, [place], y la muerte de un familiar o amigo cercano. A veces aparece sin ninguna razn evidente. Las personas que tienen familiares con depresin mayor o con trastorno bipolar tienen ms riesgo de desarrollar depresin mayor con o sin factores de Psychologist, forensic. Puede ocurrir a Hotel manager. Puede ocurrir slo una vez en su vida(episodio nico de trastorno depresivo mayor). Puede ocurrir varias veces (trastorno depresivo mayor recurrente).  SNTOMAS  Las personas con trastorno depresivo mayor presentan anhedonia o estado de nimo deprimido casi [employer] durante al menos 2 semanas o ms. Los sntomas son:   Sensacin de tristeza Secretary/administrator) o vaco.  Sentimientos de desesperanza o desamparo.  Lagrimeo o episodios de llanto ( es observado por los dems).  Irritabilidad (en nios y adolescentes). Adems del estado de nimo deprimido o la anhedonia o ambos, estos enfermos tienen al menos cuatro de los siguientes sntomas :   Dificultad para dormir o Aeronautical engineer.   Cambio significativo (aumento o disminucin) en el apetito o Financial controller.   Falta de energa o  motivacin.  Sentimientos de culpa o desvalorizacin.   Dificultad para concentrarse, recordar o tomar decisiones.  Movimientos inusualmente lentos (retardo psicomotor retardation) o inquietud (segn lo observado por los dems).   Deseos recurrentes de muerte, pensamientos recurrentes de autoagresin (suicidio) o intento de suicidio. Las personas con trastorno depresivo mayor suelen tener pensamientos persistentes negativos acerca de s mismos, de otras personas y del mundo. Las personas con trastorno depresivo mayor grave pueden experimentar creencias o percepciones distorsionadas sobre el mundo (delirios psicticos). Tambin pueden ver u or cosas que no son reales(alucinaciones psicticas).  DIAGNSTICO  El diagnstico se realiza mediante una evaluacin hecha por el mdico. El mdico le preguntar acerca de los aspectos de su vida cotidiana, como el [place] de nimo, el sueo y el apetito, para ver si usted tiene los sntomas de Environmental education officer. Le har preguntas sobre su historial mdico y el consumo de alcohol o drogas, incluyendo medicamentos recetados. [employer] un examen fsico y Charity fundraiser anlisis de [place]. Esto se debe a que ciertas enfermedades y el uso de determinadas sustancias pueden causar sntomas similares a la depresin (depresin secundaria). Su mdico tambin podra derivarlo a Location manager salud mental para Ardelia Mems evaluacin y [place].  TRATAMIENTO  Es Glass blower/designer los sntomas y Geographical information systems officer. Los siguientes tratamientos pueden indicarse:    Medicamentos - Generalmente se recetan antidepresivos. Los antidepresivos se piensa que corrigen los desequilibrios qumicos en el cerebro que se asocian comnmente a la depresin Chiropractor. Se pueden agregar otros tipos de medicamentos si los sntomas no responden a los antidepresivos solos o si hay ideas delirantes o alucinaciones psicticas.  Psicoterapia - ciertos tipos de psicoterapia pueden ser tiles en el  tratamiento del trastorno de  la depresin mayor, proporcionando apoyo, educacin y orientacin. Ciertos tipos de psicoterapia tambin pueden ayudar a superar los pensamientos negativos (terapia cognitivo conductual) y a problemas de relacin que desencadena la depresin mayor (terapia interpersonal). Un especialista en salud mental puede ayudarlo a determinar qu tratamiento es el mejor para usted. [employer] de los pacientes mejoran con una combinacin de medicacin y psicoterapia. Los tratamientos que implican la estimulacin elctrica del cerebro pueden ser utilizados en situaciones con sntomas muy graves o cuando los medicamentos y la psicoterapia no funcionan despus de un [place]. Estos tratamientos incluyen terapia electroconvulsiva, estimulacin magntica transcraneal y estimulacin del nervio vago.  Document Released: 10/24/2012 Document Revised: 11/13/2013 [facility] Patient Information 2015 [employer]. This information is not intended to replace advice given to you by your health care provider. Make sure you discuss any questions you have with your health care provider.

## 2014-03-21 NOTE — Progress Notes (Signed)
[place],  Did not get to see patient but will be sending information about [facility] program to help patient establish primary care, using the address provided.

## 2014-04-03 ENCOUNTER — Encounter (HOSPITAL_COMMUNITY): Payer: Self-pay | Admitting: Emergency Medicine

## 2014-04-03 ENCOUNTER — Emergency Department (HOSPITAL_COMMUNITY)
Admission: EM | Admit: 2014-04-03 | Discharge: 2014-04-03 | Disposition: A | Discharge status: Home / Self Care | Payer: Self-pay | Attending: Emergency Medicine | Admitting: Emergency Medicine

## 2014-04-03 DIAGNOSIS — L0231 Cutaneous abscess of buttock: Secondary | ICD-10-CM | POA: Insufficient documentation

## 2014-04-03 DIAGNOSIS — L03317 Cellulitis of buttock: Secondary | ICD-10-CM

## 2014-04-03 DIAGNOSIS — Z79899 Other long term (current) drug therapy: Secondary | ICD-10-CM | POA: Insufficient documentation

## 2014-04-03 DIAGNOSIS — F319 Bipolar disorder, unspecified: Secondary | ICD-10-CM | POA: Insufficient documentation

## 2014-04-03 DIAGNOSIS — J45909 Unspecified asthma, uncomplicated: Secondary | ICD-10-CM | POA: Insufficient documentation

## 2014-04-03 MED ORDER — CLINDAMYCIN HCL 150 MG PO CAPS
300.0000 mg | ORAL_CAPSULE | Freq: Once | ORAL | Status: AC
  Administered 2014-04-03: 300 mg via ORAL
  Filled 2014-04-03: qty 2

## 2014-04-03 MED ORDER — SULFAMETHOXAZOLE-TRIMETHOPRIM 800-160 MG PO TABS: 1.0000 | ORAL_TABLET | Freq: Two times a day (BID) | ORAL | Status: DC

## 2014-04-03 MED ORDER — OXYCODONE-ACETAMINOPHEN 5-325 MG PO TABS
2.0000 | ORAL_TABLET | Freq: Once | ORAL | Status: AC
  Administered 2014-04-03: 2 via ORAL
  Filled 2014-04-03: qty 2

## 2014-04-03 MED ORDER — CEPHALEXIN 500 MG PO CAPS: 500.0000 mg | ORAL_CAPSULE | Freq: Four times a day (QID) | ORAL | Status: DC

## 2014-04-03 MED ORDER — OXYCODONE-ACETAMINOPHEN 5-325 MG PO TABS: 2.0000 | ORAL_TABLET | ORAL | Status: DC | PRN

## 2014-04-03 NOTE — ED Provider Notes (Signed)
CSN: [id number]     Arrival date & time 04/03/14  1029 History  This chart was scribed for non-physician practitioner Alvina Chou, PA-C working with Orlie Dakin, MD by Ludger Nutting, ED Scribe. This patient was seen in room TR11C/TR11C and the patient's care was started at 11:25 AM.    Chief Complaint  Patient presents with  . Abscess   The history is provided by the patient. No language interpreter was used.    HPI Comments: Nancy Mckinney is a 43 y.o. female who presents to the Emergency Department complaining of gradual onset, constant, gradually worsened area of redness, pain, and swelling to the left buttocks. She believes to have been bitten by a spider but states she did not see or feel one. She has taken Aleve without relief. She denies fever, chills, or drainage from the site.   Past Medical History  Diagnosis Date  . Asthma   . Bipolar 1 disorder    Past Surgical History  Procedure Laterality Date  . Abdominal surgery     No family history on file. History  Substance Use Topics  . Smoking status: Never Smoker   . Smokeless tobacco: Not on file  . Alcohol Use: Yes   OB History   Grav Para Term Preterm Abortions TAB SAB Ect Mult Living                 Review of Systems  Constitutional: Negative for fever, chills and fatigue.  HENT: Negative for trouble swallowing.   Eyes: Negative for visual disturbance.  Respiratory: Negative for shortness of breath.   Cardiovascular: Negative for chest pain and palpitations.  Gastrointestinal: Negative for nausea, vomiting, abdominal pain and diarrhea.  Genitourinary: Negative for dysuria and difficulty urinating.  Musculoskeletal: Negative for arthralgias and neck pain.  Skin: Positive for wound. Negative for color change.  Neurological: Negative for dizziness and weakness.  Psychiatric/Behavioral: Negative for dysphoric mood.      Allergies  Review of patient's allergies indicates no known allergies.  Home  Medications   Prior to Admission medications   Medication Sig Start Date End Date Taking? Authorizing Provider  albuterol (PROVENTIL HFA;VENTOLIN HFA) 108 (90 BASE) MCG/ACT inhaler Inhale 1 puff into the lungs every 6 (six) hours as needed for wheezing or shortness of breath.    Historical Provider, MD  FLUoxetine (PROZAC) 10 MG capsule Take 10 mg by mouth daily.    Historical Provider, MD  Prenatal Vit-Fe Fumarate-FA (MULTIVITAMIN-PRENATAL) 27-0.8 MG TABS tablet Take 1 tablet by mouth daily at 12 noon.    Historical Provider, MD  QUEtiapine (SEROQUEL XR) 300 MG 24 hr tablet Take 300 mg by mouth daily.    Historical Provider, MD  QUEtiapine (SEROQUEL) 300 MG tablet Take 1 tablet (300 mg total) by mouth at bedtime. 03/21/14   Mojeed Akintayo  sertraline (ZOLOFT) 100 MG tablet Take 1 tablet (100 mg total) by mouth daily. 03/21/14   Mojeed Akintayo   BP 132/82  Pulse 87  Temp(Src) 98.6 F (37 C) (Oral)  Resp 18  Ht 5\' 6"  (1.676 m)  Wt 225 lb (102.059 kg)  BMI 36.33 kg/m2  SpO2 99%  LMP 02/24/2014 Physical Exam  Nursing note and vitals reviewed. Constitutional: She is oriented to person, place, and time. She appears well-developed and well-nourished. No distress.  HENT:  Head: Normocephalic and atraumatic.  Eyes: Conjunctivae and EOM are normal.  Neck: Normal range of motion.  Cardiovascular: Normal rate and regular rhythm.  Exam reveals no gallop and no  friction rub.   No murmur heard. Pulmonary/Chest: Effort normal and breath sounds normal. She has no wheezes. She has no rales. She exhibits no tenderness.  Abdominal: Soft. She exhibits no distension. There is no tenderness.  Musculoskeletal: Normal range of motion.  Neurological: She is alert and oriented to person, place, and time.  Speech is goal-oriented. Moves limbs without ataxia.   Skin: Skin is warm and dry. There is erythema.  6 x 6 cm area of induration and erythema of left buttock with central scab. No fluctuance or open wound  noted. No drainage. No rectal involvement.   Psychiatric: She has a normal mood and affect.    ED Course  Procedures (including critical care time)  DIAGNOSTIC STUDIES: Oxygen Saturation is 99% on RA, normal by my interpretation.    COORDINATION OF CARE: 11:27 AM Will prescribe pain medication and antibiotics and advised to use warm compresses. Discussed treatment plan with pt at bedside and pt agreed to plan.   Labs Review Labs Reviewed - No data to display  Imaging Review No results found.   EKG Interpretation None      MDM   Final diagnoses:  Cellulitis of buttock, left    Patient has an area of cellulitis of the left buttock which appears to be the result of an insect bite. Patient has no fluctuance to indicate drainable abscess at this time. Patient will have percocet, keflex, and bactrim for infection. Patient given strict return precautions and instructed to apply warm compresses to the area. Vitals stable and patient afebrile. Patient understands and agrees with plan.   I personally performed the services described in this documentation, which was scribed in my presence. The recorded information has been reviewed and is accurate.   Alvina Chou, PA-C 04/03/14 1229

## 2014-04-03 NOTE — Discharge Instructions (Signed)
Take percocet as needed for pain. Take Bactrim and Keflex as directed until gone. Refer to attached documents for more information. Return to the ED with worsening or concerning symptoms.

## 2014-04-03 NOTE — ED Notes (Signed)
Patient believes she has a spider bit on L buttock.  Patient states she "popped it a little, but I couldn't get a lot".   Patient did not see spider or feel bite.

## 2014-04-03 NOTE — ED Provider Notes (Signed)
Medical screening examination/treatment/procedure(s) were performed by non-physician practitioner and as supervising physician I was immediately available for consultation/collaboration.   EKG Interpretation None       Orlie Dakin, MD 04/03/14 1558

## 2014-04-24 ENCOUNTER — Inpatient Hospital Stay (HOSPITAL_COMMUNITY)
Admission: AD | Admit: 2014-04-24 | Discharge: 2014-05-01 | DRG: 885 | Disposition: A | Discharge status: Rehabilitation Facility | Payer: Federal, State, Local not specified - Other | Source: Intra-hospital | Attending: Psychiatry | Admitting: Psychiatry

## 2014-04-24 ENCOUNTER — Emergency Department (HOSPITAL_COMMUNITY)
Admission: EM | Admit: 2014-04-24 | Discharge: 2014-04-24 | Disposition: A | Discharge status: Other Acute Inpatient Hospital | Payer: Self-pay | Attending: Emergency Medicine | Admitting: Emergency Medicine

## 2014-04-24 ENCOUNTER — Encounter (HOSPITAL_COMMUNITY): Payer: Self-pay | Admitting: Emergency Medicine

## 2014-04-24 ENCOUNTER — Encounter (HOSPITAL_COMMUNITY): Payer: Self-pay | Admitting: *Deleted

## 2014-04-24 DIAGNOSIS — F4310 Post-traumatic stress disorder, unspecified: Secondary | ICD-10-CM | POA: Diagnosis present

## 2014-04-24 DIAGNOSIS — R197 Diarrhea, unspecified: Secondary | ICD-10-CM | POA: Insufficient documentation

## 2014-04-24 DIAGNOSIS — F1020 Alcohol dependence, uncomplicated: Secondary | ICD-10-CM | POA: Diagnosis present

## 2014-04-24 DIAGNOSIS — Z634 Disappearance and death of family member: Secondary | ICD-10-CM | POA: Diagnosis not present

## 2014-04-24 DIAGNOSIS — F331 Major depressive disorder, recurrent, moderate: Secondary | ICD-10-CM

## 2014-04-24 DIAGNOSIS — G4700 Insomnia, unspecified: Secondary | ICD-10-CM | POA: Diagnosis present

## 2014-04-24 DIAGNOSIS — J45909 Unspecified asthma, uncomplicated: Secondary | ICD-10-CM | POA: Diagnosis present

## 2014-04-24 DIAGNOSIS — Z3202 Encounter for pregnancy test, result negative: Secondary | ICD-10-CM | POA: Insufficient documentation

## 2014-04-24 DIAGNOSIS — R45851 Suicidal ideations: Secondary | ICD-10-CM

## 2014-04-24 DIAGNOSIS — Z792 Long term (current) use of antibiotics: Secondary | ICD-10-CM | POA: Insufficient documentation

## 2014-04-24 DIAGNOSIS — Z590 Homelessness: Secondary | ICD-10-CM | POA: Diagnosis not present

## 2014-04-24 DIAGNOSIS — F3130 Bipolar disorder, current episode depressed, mild or moderate severity, unspecified: Secondary | ICD-10-CM

## 2014-04-24 DIAGNOSIS — F411 Generalized anxiety disorder: Secondary | ICD-10-CM | POA: Diagnosis present

## 2014-04-24 DIAGNOSIS — Z79899 Other long term (current) drug therapy: Secondary | ICD-10-CM | POA: Insufficient documentation

## 2014-04-24 DIAGNOSIS — Z23 Encounter for immunization: Secondary | ICD-10-CM

## 2014-04-24 DIAGNOSIS — K589 Irritable bowel syndrome without diarrhea: Secondary | ICD-10-CM | POA: Diagnosis present

## 2014-04-24 DIAGNOSIS — F314 Bipolar disorder, current episode depressed, severe, without psychotic features: Principal | ICD-10-CM | POA: Diagnosis present

## 2014-04-24 DIAGNOSIS — F323 Major depressive disorder, single episode, severe with psychotic features: Secondary | ICD-10-CM

## 2014-04-24 DIAGNOSIS — F3110 Bipolar disorder, current episode manic without psychotic features, unspecified: Secondary | ICD-10-CM

## 2014-04-24 DIAGNOSIS — F1024 Alcohol dependence with alcohol-induced mood disorder: Secondary | ICD-10-CM

## 2014-04-24 HISTORY — DX: Major depressive disorder, single episode, unspecified: F32.9

## 2014-04-24 HISTORY — DX: Depression, unspecified: F32.A

## 2014-04-24 HISTORY — DX: Irritable bowel syndrome, unspecified: K58.9

## 2014-04-24 LAB — CBC
HEMATOCRIT: 41.5 % (ref 36.0–46.0)
Hemoglobin: 14.2 g/dL (ref 12.0–15.0)
MCH: 30.5 pg (ref 26.0–34.0)
MCHC: 34.2 g/dL (ref 30.0–36.0)
MCV: 89.1 fL (ref 78.0–100.0)
PLATELETS: 165 10*3/uL (ref 150–400)
RBC: 4.66 MIL/uL (ref 3.87–5.11)
RDW: 12.8 % (ref 11.5–15.5)
WBC: 6.6 10*3/uL (ref 4.0–10.5)

## 2014-04-24 LAB — RAPID URINE DRUG SCREEN, HOSP PERFORMED
AMPHETAMINES: NOT DETECTED
BENZODIAZEPINES: NOT DETECTED
Barbiturates: NOT DETECTED
COCAINE: NOT DETECTED
OPIATES: NOT DETECTED
TETRAHYDROCANNABINOL: NOT DETECTED

## 2014-04-24 LAB — COMPREHENSIVE METABOLIC PANEL
ALBUMIN: 4.2 g/dL (ref 3.5–5.2)
ALT: 140 U/L — AB (ref 0–35)
AST: 196 U/L — AB (ref 0–37)
Alkaline Phosphatase: 99 U/L (ref 39–117)
Anion gap: 15 (ref 5–15)
BILIRUBIN TOTAL: 0.9 mg/dL (ref 0.3–1.2)
BUN: 11 mg/dL (ref 6–23)
CHLORIDE: 103 meq/L (ref 96–112)
CO2: 22 mEq/L (ref 19–32)
Calcium: 9.5 mg/dL (ref 8.4–10.5)
Creatinine, Ser: 0.65 mg/dL (ref 0.50–1.10)
GFR calc Af Amer: >90 mL/min (ref >90)
GFR calc non Af Amer: >90 mL/min (ref >90)
Glucose, Bld: 111 mg/dL — ABNORMAL HIGH (ref 70–99)
Potassium: 3.8 mEq/L (ref 3.7–5.3)
Sodium: 140 mEq/L (ref 137–147)
Total Protein: 9.4 g/dL — ABNORMAL HIGH (ref 6.0–8.3)

## 2014-04-24 LAB — ETHANOL: Alcohol, Ethyl (B): <11 mg/dL (ref 0–11)

## 2014-04-24 LAB — POC URINE PREG, ED: Preg Test, Ur: NEGATIVE

## 2014-04-24 LAB — SALICYLATE LEVEL: Salicylate Lvl: <2 mg/dL — ABNORMAL LOW (ref 2.8–20.0)

## 2014-04-24 LAB — ACETAMINOPHEN LEVEL: Acetaminophen (Tylenol), Serum: <15 ug/mL (ref 10–30)

## 2014-04-24 MED ORDER — PRENATAL 27-0.8 MG PO TABS
1.0000 | ORAL_TABLET | Freq: Every day | ORAL | Status: DC
  Filled 2014-04-24: qty 1

## 2014-04-24 MED ORDER — ALBUTEROL SULFATE HFA 108 (90 BASE) MCG/ACT IN AERS
1.0000 | INHALATION_SPRAY | Freq: Four times a day (QID) | RESPIRATORY_TRACT | Status: DC | PRN
  Administered 2014-04-24: 1 via RESPIRATORY_TRACT
  Filled 2014-04-24: qty 6.7

## 2014-04-24 MED ORDER — TRAZODONE HCL 50 MG PO TABS
50.0000 mg | ORAL_TABLET | Freq: Every evening | ORAL | Status: DC | PRN
Start: 2014-04-25 — End: 2014-04-25
  Filled 2014-04-24 (×4): qty 1

## 2014-04-24 MED ORDER — QUETIAPINE FUMARATE ER 300 MG PO TB24
300.0000 mg | ORAL_TABLET | Freq: Every day | ORAL | Status: DC
  Administered 2014-04-24: 300 mg via ORAL
  Filled 2014-04-24: qty 1

## 2014-04-24 MED ORDER — MAGNESIUM HYDROXIDE 400 MG/5ML PO SUSP: 30.0000 mL | Freq: Every day | ORAL | Status: DC | PRN

## 2014-04-24 MED ORDER — QUETIAPINE FUMARATE ER 300 MG PO TB24
300.0000 mg | ORAL_TABLET | Freq: Every evening | ORAL | Status: DC
  Administered 2014-04-25: 300 mg via ORAL
  Filled 2014-04-24 (×2): qty 1

## 2014-04-24 MED ORDER — QUETIAPINE FUMARATE 25 MG PO TABS: 300.0000 mg | ORAL_TABLET | Freq: Every day | ORAL | Status: DC

## 2014-04-24 MED ORDER — INFLUENZA VAC SPLIT QUAD 0.5 ML IM SUSY
0.5000 mL | PREFILLED_SYRINGE | INTRAMUSCULAR | Status: AC
  Administered 2014-04-26: 0.5 mL via INTRAMUSCULAR
  Filled 2014-04-24: qty 0.5

## 2014-04-24 MED ORDER — LORAZEPAM 1 MG PO TABS: 1.0000 mg | ORAL_TABLET | Freq: Three times a day (TID) | ORAL | Status: DC | PRN

## 2014-04-24 MED ORDER — ALBUTEROL SULFATE HFA 108 (90 BASE) MCG/ACT IN AERS
1.0000 | INHALATION_SPRAY | Freq: Four times a day (QID) | RESPIRATORY_TRACT | Status: DC | PRN
  Administered 2014-04-30: 1 via RESPIRATORY_TRACT
  Filled 2014-04-24: qty 6.7

## 2014-04-24 MED ORDER — IBUPROFEN 600 MG PO TABS
600.0000 mg | ORAL_TABLET | Freq: Four times a day (QID) | ORAL | Status: DC | PRN
  Administered 2014-04-26 (×3): 600 mg via ORAL
  Filled 2014-04-24 (×3): qty 1

## 2014-04-24 MED ORDER — NICOTINE 21 MG/24HR TD PT24: 21.0000 mg | MEDICATED_PATCH | Freq: Every day | TRANSDERMAL | Status: DC

## 2014-04-24 MED ORDER — ACETAMINOPHEN 325 MG PO TABS: 650.0000 mg | ORAL_TABLET | ORAL | Status: DC | PRN

## 2014-04-24 MED ORDER — ZOLPIDEM TARTRATE 5 MG PO TABS: 5.0000 mg | ORAL_TABLET | Freq: Every evening | ORAL | Status: DC | PRN

## 2014-04-24 MED ORDER — SERTRALINE HCL 50 MG PO TABS: 100.0000 mg | ORAL_TABLET | Freq: Every day | ORAL | Status: DC

## 2014-04-24 MED ORDER — FLUOXETINE HCL 10 MG PO CAPS
10.0000 mg | ORAL_CAPSULE | Freq: Every day | ORAL | Status: DC
  Filled 2014-04-24: qty 1

## 2014-04-24 MED ORDER — ALUM & MAG HYDROXIDE-SIMETH 200-200-20 MG/5ML PO SUSP
30.0000 mL | ORAL | Status: DC | PRN
Start: 2014-04-24 — End: 2014-05-01

## 2014-04-24 MED ORDER — IBUPROFEN 400 MG PO TABS: 600.0000 mg | ORAL_TABLET | Freq: Three times a day (TID) | ORAL | Status: DC | PRN

## 2014-04-24 MED ORDER — PRENATAL 27-0.8 MG PO TABS: 1.0000 | ORAL_TABLET | Freq: Every day | ORAL | Status: DC

## 2014-04-24 MED ORDER — HALOPERIDOL 5 MG PO TABS
5.0000 mg | ORAL_TABLET | Freq: Four times a day (QID) | ORAL | Status: DC | PRN
  Administered 2014-04-27 (×2): 5 mg via ORAL
  Filled 2014-04-24 (×2): qty 1

## 2014-04-24 MED ORDER — ONDANSETRON HCL 4 MG PO TABS: 4.0000 mg | ORAL_TABLET | Freq: Three times a day (TID) | ORAL | Status: DC | PRN

## 2014-04-24 MED ORDER — PNEUMOCOCCAL VAC POLYVALENT 25 MCG/0.5ML IJ INJ
0.5000 mL | INJECTION | INTRAMUSCULAR | Status: AC
  Administered 2014-04-26: 0.5 mL via INTRAMUSCULAR

## 2014-04-24 MED ORDER — SERTRALINE HCL 100 MG PO TABS
100.0000 mg | ORAL_TABLET | Freq: Every day | ORAL | Status: DC
  Administered 2014-04-25: 100 mg via ORAL
  Filled 2014-04-24 (×3): qty 1

## 2014-04-24 NOTE — ED Notes (Addendum)
Per [name], [facility], pt accepted to [facility] 306-2 Nancy Jack, MD, accepting. Requested for staff to wait until after 2030 to send pt.

## 2014-04-24 NOTE — ED Notes (Signed)
Per Lyda Jester, [facility], will need C-Diff result prior to pt being transported to [facility]. C Forcucci, PA, advised RN she will speak w/EDP.

## 2014-04-24 NOTE — BHH Counselor (Addendum)
Counselor informed Courtney-PA ([id number]) of Dr. Barnetta Hammersmith recommendation and placement at [facility].  Lorenza Cambridge, [facility] Triage Specialist

## 2014-04-24 NOTE — ED Notes (Addendum)
Pt states "I really just want to be gone.  I have nothing to live for.  I just don't want to be here anymore."  Pt states she has difficulty sleeping at night and has thoughts and images that she can't get to "stop or slow down".  Pt consistently sees images of finding her son hanging dead from a rope (he committed suicide last Dec and pt found him).  Pt stated "I wish I'd just taken the pills before my daughter got there." Pt's daughter brought her to the ED today.  Pt reports scalp pain from pulling her hair out all night and that she is currently living with her ex-husband which is not helping her emotional status.

## 2014-04-24 NOTE — BHH Counselor (Signed)
Counselor consulted with Dr. Louretta Shorten. Per Dr. Louretta Shorten the Pt meets inpatient criteria at [facility]. [facility] accepted the Pt to room 306-2.  Lorenza Cambridge, [facility] Triage Specialist

## 2014-04-24 NOTE — ED Notes (Signed)
Pt states she has so much stuff going on and can't get her head to relax. States she had so many pills today at home that she thought about taking.
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
## 2014-04-24 NOTE — BHH Counselor (Signed)
Counselor consulted with Becky-NP and Courtney-PA about Pt's presentation.  Lorenza Cambridge, [facility] Triage Specialist

## 2014-04-24 NOTE — ED Notes (Signed)
Pt has been wanded by security. 

## 2014-04-24 NOTE — ED Provider Notes (Signed)
CSN: [id number]     Arrival date & time 04/24/14  1455 History   First MD Initiated Contact with Patient 04/24/14 1522     Chief Complaint  Patient presents with  . Suicidal   HPI  Patient is a 43 y.o. Female with PMH and bipolar who presents to the ED with suicidal ideations.  Patient states that she has not been feeling her self for the past several days.  She feels that she has no positive things in her life and that she keeps having flashbacks of seeing her dead son who hanged himself.  Patient feels like she wants to kill herself because she doesn't want to be here in life anymore.  She was thinking about taking all of her pills or hanging herself like her son.  Patient states that she was caught by a friend who took her here.  She also feels like she is constantly hearing voices which are telling her negative things.  Patient also admits to feeling like wanting to hurt her ex husband who "won't leave her alone".  Patient has been taking her medications for bipolar, but feels that they aren't working well anymore.  Patient was recently seen here for an indurated abscess on the buttock and was placed on keflex and bactrim at that time.  She has had 20 episodes of diarrhea a day since finishing her antibiotics.      Past Medical History  Diagnosis Date  . Asthma   . Bipolar 1 disorder    Past Surgical History  Procedure Laterality Date  . Abdominal surgery     No family history on file. History  Substance Use Topics  . Smoking status: Never Smoker   . Smokeless tobacco: Not on file  . Alcohol Use: Yes     Comment: had 1 can of beer today.    OB History   Grav Para Term Preterm Abortions TAB SAB Ect Mult Living                 Review of Systems  Constitutional: Negative for fever, chills and fatigue.  Respiratory: Negative for chest tightness and shortness of breath.   Cardiovascular: Negative for chest pain, palpitations and leg swelling.  Gastrointestinal: Positive for  diarrhea. Negative for nausea, vomiting, constipation, blood in stool, anal bleeding and rectal pain.  Genitourinary: Negative for dysuria, urgency, frequency, hematuria and difficulty urinating.  All other systems reviewed and are negative.     Allergies  Cephalosporins  Home Medications   Prior to Admission medications   Medication Sig Start Date End Date Taking? Authorizing Provider  albuterol (PROVENTIL HFA;VENTOLIN HFA) 108 (90 BASE) MCG/ACT inhaler Inhale 1 puff into the lungs every 6 (six) hours as needed for wheezing or shortness of breath.   Yes Historical Provider, MD  Prenatal Vit-Fe Fumarate-FA (MULTIVITAMIN-PRENATAL) 27-0.8 MG TABS tablet Take 1 tablet by mouth daily at 12 noon.   Yes Historical Provider, MD  QUEtiapine (SEROQUEL XR) 300 MG 24 hr tablet Take 300 mg by mouth every evening.    Yes Historical Provider, MD  sertraline (ZOLOFT) 100 MG tablet Take 1 tablet (100 mg total) by mouth daily. 03/21/14  Yes Mojeed Akintayo  cephALEXin (KEFLEX) 500 MG capsule Take 1 capsule (500 mg total) by mouth 4 (four) times daily. 04/03/14   Kaitlyn Szekalski, PA-C  FLUoxetine (PROZAC) 10 MG capsule Take 10 mg by mouth daily.    Historical Provider, MD  oxyCODONE-acetaminophen (PERCOCET/ROXICET) 5-325 MG per tablet Take 2 tablets by  mouth every 4 (four) hours as needed for moderate pain or severe pain. 04/03/14   Kaitlyn Szekalski, PA-C  QUEtiapine (SEROQUEL) 300 MG tablet Take 1 tablet (300 mg total) by mouth at bedtime. 03/21/14   Mojeed Akintayo  sulfamethoxazole-trimethoprim (SEPTRA DS) 800-160 MG per tablet Take 1 tablet by mouth every 12 (twelve) hours. 04/03/14   Kaitlyn Szekalski, PA-C   BP 201/110  Pulse 86  Temp(Src) 98.5 F (36.9 C) (Oral)  Resp 18  Ht 5\' 5"  (1.651 m)  Wt 220 lb (99.791 kg)  BMI 36.61 kg/m2  SpO2 96%  LMP 03/29/2014 Physical Exam  Nursing note and vitals reviewed. Constitutional: She is oriented to person, place, and time. She appears well-developed and  well-nourished. No distress.  HENT:  Head: Normocephalic and atraumatic.  Mouth/Throat: Oropharynx is clear and moist. No oropharyngeal exudate.  Eyes: Conjunctivae and EOM are normal. Pupils are equal, round, and reactive to light. No scleral icterus.  Neck: Normal range of motion. Neck supple. No JVD present. No thyromegaly present.  Cardiovascular: Normal rate, regular rhythm, normal heart sounds and intact distal pulses.  Exam reveals no gallop and no friction rub.   No murmur heard. Pulmonary/Chest: Effort normal and breath sounds normal. No respiratory distress. She has no wheezes. She has no rales. She exhibits no tenderness.  Abdominal: Soft. Bowel sounds are normal. She exhibits no distension and no mass. There is no tenderness. There is no rebound and no guarding.  Musculoskeletal: Normal range of motion.  Lymphadenopathy:    She has no cervical adenopathy.  Neurological: She is alert and oriented to person, place, and time. She has normal strength. No cranial nerve deficit or sensory deficit. Coordination normal.  Skin: Skin is warm and dry. She is not diaphoretic.  Psychiatric: She has a normal mood and affect. Her behavior is normal. Judgment and thought content normal.    ED Course  Procedures (including critical care time) Labs Review Labs Reviewed  COMPREHENSIVE METABOLIC PANEL - Abnormal; Notable for the following:    Glucose, Bld 111 (*)    Total Protein 9.4 (*)    AST 196 (*)    ALT 140 (*)    All other components within normal limits  SALICYLATE LEVEL - Abnormal; Notable for the following:    Salicylate Lvl <1.7 (*)    All other components within normal limits  CLOSTRIDIUM DIFFICILE BY PCR  CBC  ETHANOL  ACETAMINOPHEN LEVEL  URINE RAPID DRUG SCREEN (HOSP PERFORMED)  POC URINE PREG, ED    Imaging Review No results found.   EKG Interpretation None      MDM   Final diagnoses:  Suicidal ideations  Major depressive disorder, single episode, severe  with psychotic features   Patient is a 43 y.o. Female who presents to the ED with suicidal ideations with clear plan. Physical exam unremarkable.  Labs unremarkable other than elevation of AST and ALT.  Given diarrhea and recent abx use patient to have PCR for C. Diff which is pending. TTS is has been consulted and is currently pending.  Will place in pod C and will have the patient evaluated by psych.    Patient has had no further diarrhea here in the ED.  Suspect that this was likely abx associated diarrhea and not C. Diff.  Have cancelled the PCR.  Patient has a bed at [facility].  Will fill out EMTALA and send the patient to [facility].  I have spoken with Randall Hiss the [facility] at [facility] who is aware  that the patient likely does not have C. Diff.    Cherylann Parr, PA-C 04/24/14 1654  Cherylann Parr, PA-C 04/24/14 1954

## 2014-04-24 NOTE — ED Provider Notes (Signed)
  Face-to-face evaluation   History: she presents for evaluation of suicidal ideation, for 3 days. She took 3 Zoloft, and was ready to take some more when her daughter stopped her. She was recently changed from Prozac to Zoloft, because of anxiety.  Physical exam:alert calm, cooperative. She is mildly anxious. She is not psychotic.  Medical screening examination/treatment/procedure(s) were conducted as a shared visit with non-physician practitioner(s) and myself.  I personally evaluated the patient during the encounter  Richarda Blade, MD 04/25/14 [phone]

## 2014-04-24 NOTE — BHH Counselor (Signed)
Assessment complete.  Ardra Kuznicki, LPC Triage Specialist 

## 2014-04-25 DIAGNOSIS — R45851 Suicidal ideations: Secondary | ICD-10-CM

## 2014-04-25 LAB — HEPATIC FUNCTION PANEL
ALT: 134 U/L — ABNORMAL HIGH (ref 0–35)
AST: 176 U/L — AB (ref 0–37)
Albumin: 3.7 g/dL (ref 3.5–5.2)
Alkaline Phosphatase: 90 U/L (ref 39–117)
BILIRUBIN INDIRECT: 0.7 mg/dL (ref 0.3–0.9)
Bilirubin, Direct: 0.2 mg/dL (ref 0.0–0.3)
TOTAL PROTEIN: 8.3 g/dL (ref 6.0–8.3)
Total Bilirubin: 0.9 mg/dL (ref 0.3–1.2)

## 2014-04-25 LAB — LIPID PANEL
CHOL/HDL RATIO: 3.9 ratio
Cholesterol: 135 mg/dL (ref 0–200)
HDL: 35 mg/dL — AB (ref >39)
LDL CALC: 69 mg/dL (ref 0–99)
Triglycerides: 154 mg/dL — ABNORMAL HIGH (ref <150)
VLDL: 31 mg/dL (ref 0–40)

## 2014-04-25 LAB — TSH: TSH: 2.42 u[IU]/mL (ref 0.350–4.500)

## 2014-04-25 MED ORDER — TRAZODONE HCL 100 MG PO TABS
100.0000 mg | ORAL_TABLET | Freq: Every evening | ORAL | Status: DC | PRN
  Administered 2014-04-25 – 2014-04-30 (×9): 100 mg via ORAL
  Filled 2014-04-25 (×19): qty 1

## 2014-04-25 MED ORDER — ADULT MULTIVITAMIN W/MINERALS CH
1.0000 | ORAL_TABLET | Freq: Every day | ORAL | Status: DC
  Administered 2014-04-25 – 2014-05-01 (×7): 1 via ORAL
  Filled 2014-04-25 (×10): qty 1

## 2014-04-25 MED ORDER — SERTRALINE HCL 50 MG PO TABS
150.0000 mg | ORAL_TABLET | Freq: Every day | ORAL | Status: DC
  Administered 2014-04-26 – 2014-05-01 (×6): 150 mg via ORAL
  Filled 2014-04-25 (×4): qty 3
  Filled 2014-04-25: qty 42
  Filled 2014-04-25 (×3): qty 3

## 2014-04-25 MED ORDER — VITAMIN B-1 100 MG PO TABS
100.0000 mg | ORAL_TABLET | Freq: Every day | ORAL | Status: DC
  Administered 2014-04-25 – 2014-05-01 (×7): 100 mg via ORAL
  Filled 2014-04-25 (×10): qty 1

## 2014-04-25 MED ORDER — CHLORDIAZEPOXIDE HCL 25 MG PO CAPS
25.0000 mg | ORAL_CAPSULE | Freq: Four times a day (QID) | ORAL | Status: DC | PRN
  Administered 2014-04-25 – 2014-04-30 (×7): 25 mg via ORAL
  Filled 2014-04-25 (×7): qty 1

## 2014-04-25 MED ORDER — PRENATAL MULTIVITAMIN CH
1.0000 | ORAL_TABLET | Freq: Every day | ORAL | Status: DC
  Administered 2014-04-25: 1 via ORAL
  Filled 2014-04-25 (×2): qty 1
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
## 2014-04-25 NOTE — Progress Notes (Signed)
D: Patient denies HI and A/V hallucinations; patient reports on and off thoughts of SI but contracts for safety; patient reports some tremors and anxiety and reporting that recently before coming in she has been drinking a fifth and half daily but that is not usual for her she reports   A: Monitored q 15 minutes; patient encouraged to attend groups; patient educated about medications; patient given medications per physician orders; patient encouraged to express feelings and/or concerns  R: Patient is quiet, flat, and depressed; patient's interaction with staff and peers is very minimal and almost isolative; patient has been in the room most of the day and even during free time she has been laying down; patient was able to set goal to talk with staff 1:1 when having feelings of SI; patient is taking medications as prescribed and tolerating medications

## 2014-04-25 NOTE — BHH Group Notes (Signed)
[facility] LCSW Aftercare Discharge Planning Group Note   04/25/2014 11:14 AM    Participation Quality:  Appropraite  Mood/Affect:  Appropriate  Depression Rating:  10  Anxiety Rating:  10  Thoughts of Suicide:  No  Will you contract for safety?   NA  Current AVH:  No  Plan for Discharge/Comments:  Patient attended discharge planning group and actively participated in group.  Patient reports admitting to hospital due to depression she has encountered since her son's suicide a year ago.  She advised of drinking a fifth of liquor several times a week.  Patient shared she is currently homeless but will be getting into an apartment next month.   Transportation Means: Patient uses public transportation.   Supports:  Patient has a limited support system.   Cylinda Santoli, Eulas Post

## 2014-04-25 NOTE — BHH Group Notes (Signed)
Adult Psychoeducational Group Note  Date:  04/25/2014 Time:  10:21 PM  Group Topic/Focus:  NA Meeting  Participation Level:  Minimal  Participation Quality:  Appropriate and Attentive  Affect:  Depressed  Cognitive:  Appropriate  Insight: Limited  Engagement in Group:  Limited  Modes of Intervention:  Discussion and Education  Additional Comments:  Nancy Mckinney attended group.  Nancy Mckinney A 04/25/2014, 10:21 PM

## 2014-04-25 NOTE — BHH Suicide Risk Assessment (Deleted)
[facility] INPATIENT:  Family/Significant Other Suicide Prevention Education  Suicide Prevention Education:  Patient Refusal for Family/Significant Other Suicide Prevention Education: The patient Nancy Mckinney has refused to provide written consent for family/significant other to be provided Family/Significant Other Suicide Prevention Education during admission and/or prior to discharge.  Physician notified.  [name] 04/25/2014, 11:14 AM

## 2014-04-25 NOTE — BHH Group Notes (Signed)
[facility] LCSW Group Therapy  Emotional Regulation 1:15 - 2: 30 PM        04/25/2014  1:47 PM    Type of Therapy:  Group Therapy  Participation Level:  Patient did not attend group - in bed.   [name]  04/25/2014 1:47 PM
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
## 2014-04-25 NOTE — [facility] Suicide Risk Assessment (Signed)
   Nursing information obtained from:  Patient Demographic factors:  Divorced or widowed;Low socioeconomic status;Unemployed Current Mental Status:  Self-harm thoughts;Belief that plan would result in death Loss Factors:  Decrease in vocational status;Loss of significant relationship;Financial problems / change in socioeconomic status (Son committed suicide last year) Historical Factors:  Family history of suicide;Anniversary of important loss Risk Reduction Factors:  Sense of responsibility to family Total Time spent with patient: 45 minutes  CLINICAL FACTORS:   Depression, Alcohol Abuse, History of Bipolar Disorder and Bereavement  Psychiatric Specialty Exam: Physical Exam  ROS  Blood pressure 98/69, pulse 88, temperature 98.4 F (36.9 C), temperature source Oral, resp. rate 18, height 5\' 5"  (1.651 m), weight 103.42 kg (228 lb), last menstrual period 03/29/2014, SpO2 95.00%.Body mass index is 37.94 kg/(m^2).  SEE ADMIT NOTE MSE  COGNITIVE FEATURES THAT CONTRIBUTE TO RISK:  Closed-mindedness    SUICIDE RISK:   Moderate:  Frequent suicidal ideation with limited intensity, and duration, some specificity in terms of plans, no associated intent, good self-control, limited dysphoria/symptomatology, some risk factors present, and identifiable protective factors, including available and accessible social support.  PLAN OF CARE: Patient will be admitted to inpatient psychiatric unit for stabilization and safety. Will provide and encourage milieu participation. Provide medication management and maked adjustments as needed. Will also provide medication management as PRN for potential alcohol withdrawal symptoms.  Will follow daily.    I certify that inpatient services furnished can reasonably be expected to improve the patient's condition.  Wendell Fiebig 04/25/2014, 12:28 PM
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
## 2014-04-25 NOTE — ED Provider Notes (Deleted)
Medical screening examination/treatment/procedure(s) were performed by non-physician practitioner and as supervising physician I was immediately available for consultation/collaboration.  Richarda Blade, MD 04/25/14 [phone]
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
## 2014-04-26 DIAGNOSIS — F3130 Bipolar disorder, current episode depressed, mild or moderate severity, unspecified: Secondary | ICD-10-CM

## 2014-04-26 DIAGNOSIS — F1410 Cocaine abuse, uncomplicated: Secondary | ICD-10-CM

## 2014-04-26 DIAGNOSIS — F1019 Alcohol abuse with unspecified alcohol-induced disorder: Secondary | ICD-10-CM

## 2014-04-26 LAB — HEMOGLOBIN A1C
HEMOGLOBIN A1C: 5.6 % (ref <5.7)
MEAN PLASMA GLUCOSE: 114 mg/dL (ref <117)

## 2014-04-26 LAB — TSH: TSH: 1.45 u[IU]/mL (ref 0.350–4.500)

## 2014-04-26 MED ORDER — PRAZOSIN HCL 1 MG PO CAPS
1.0000 mg | ORAL_CAPSULE | Freq: Every day | ORAL | Status: DC
  Administered 2014-04-26 – 2014-04-30 (×5): 1 mg via ORAL
  Filled 2014-04-26 (×6): qty 1
  Filled 2014-04-26: qty 14
  Filled 2014-04-26 (×2): qty 1

## 2014-04-26 MED ORDER — QUETIAPINE FUMARATE ER 300 MG PO TB24
300.0000 mg | ORAL_TABLET | Freq: Every evening | ORAL | Status: DC
  Administered 2014-04-26 – 2014-04-28 (×3): 300 mg via ORAL
  Filled 2014-04-26 (×5): qty 1

## 2014-04-26 NOTE — Progress Notes (Signed)
The focus of this group is to educate the patient on the purpose and policies of crisis stabilization and provide a format to answer questions about their admission.  The group details unit policies and expectations of patients while admitted. Patient did not attend this group.

## 2014-04-26 NOTE — Progress Notes (Signed)
Adult Psychoeducational Group Note  Date:  04/26/2014 Time:  10:34 PM  Group Topic/Focus:  Wrap-Up Group:   The focus of this group is to help patients review their daily goal of treatment and discuss progress on daily workbooks.  Participation Level:  Active  Participation Quality:  Appropriate  Affect:  Appropriate  Cognitive:  Alert and Appropriate  Insight: Good  Engagement in Group:  Engaged  Modes of Intervention:  Discussion  Additional Comments:  He stated that her day was all right. Her goal is to have a goal for tomorrow. She revealed that she's going to "start doing things just for her". She was engaged and cooperative during group.   Estell Harpin K 04/26/2014, 10:34 PM

## 2014-04-26 NOTE — Plan of Care (Signed)
Problem: Alteration in mood Goal: LTG-Patient reports reduction in suicidal thoughts (Patient reports reduction in suicidal thoughts and is able to verbalize a safety plan for whenever patient is feeling suicidal)  Outcome: Progressing Pt is currently denying suicidal thoughts to staff.
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
## 2014-04-26 NOTE — Progress Notes (Signed)
[facility] MD Progress Note  04/26/2014 11:05 AM Nancy Mckinney  MRN:  [id number] Subjective:   Patient states she feels " about the same", although does acknowledge some degree of improvement compared to admission status. Continues to describe some depression and also symptoms of PTSD, such as frequent intrusive memories of son's death, particularly at night time, and some nightmares, which cause her to wake up. Objective: No disruptive behaviors on unit. Visible in day room at times, but tends to spend a lot of time in her room. Denies medication side effects. As noted, has been on Seroquel XR in the past and states this medication was helpful and well tolerated , but had not  Been taking it regularly prior to admission. We have reviewed side effect profile , to include risk of movement disorder, akathisia, sedation, metabolic disturbances. Patient reports occasional hallucinations ( her name being called) but does not appear internally preoccupied, and denies any command halls. At this time not presenting with any alcohol withdrawal- appears calm, not restless or in distress, no tremors, no diaphoresis. Vitals stable. Reviewed labs- TSH within normal.  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  Respiratory: Negative for cough and shortness of breath.   Cardiovascular: Negative for chest pain.  Gastrointestinal: Negative for heartburn, nausea and vomiting.  Skin: Negative for rash.  Neurological: Negative  for dizziness and headaches.  Psychiatric/Behavioral: Positive for depression.    Blood pressure 115/69, pulse 87, temperature 97.7 F (36.5 C), temperature source Oral, resp. rate 18, height 5\' 5"  (1.651 m), weight 103.42 kg (228 lb), last menstrual period 03/29/2014, SpO2 95.00%.Body mass index is 37.94 kg/(m^2).  General Appearance: Fairly Groomed  Engineer, water::  Good  Speech:  Normal Rate  Volume:  Normal  Mood:  Depressed  Affect:  Constricted and but reactive   Thought Process:  Goal Directed and Linear  Orientation:  Other:  fully alert and attentive  Thought Content:  states she often hears her name being called when there is no one there. No delusions expressed. At this time does not appear internally preoccupied   Suicidal Thoughts:  No-at this time denies any suicidal plan or intent and contracts for safety on the unit   Homicidal Thoughts:  No  Memory:  recent and remote grossly intact   Judgement:  Fair  Insight:  Fair  Psychomotor Activity:  Normal  Concentration:  Good  Recall:  Good  Fund of Knowledge:Good  Language: Good  Akathisia:  No  Handed:  Right  AIMS (if indicated):     Assets:  Communication Skills Desire for Improvement Resilience  Sleep:  Number of Hours: 6.75   Musculoskeletal: Strength & Muscle Tone: within normal limits Gait & Station: unsteady Patient leans: N/A  Current Medications: Current Facility-Administered Medications  Medication Dose Route Frequency Provider Last Rate Last Dose  . albuterol (PROVENTIL HFA;VENTOLIN HFA) 108 (90 BASE) MCG/ACT inhaler 1 puff  1 puff Inhalation Q6H PRN Laverle Hobby, PA-C      . alum & mag hydroxide-simeth (MAALOX/MYLANTA)  200-200-20 MG/5ML suspension 30 mL  30 mL Oral Q4H PRN Laverle Hobby, PA-C      . chlordiazePOXIDE (LIBRIUM) capsule 25 mg  25 mg Oral QID PRN Neita Garnet, MD   25 mg at 04/26/14 0805  . haloperidol (HALDOL) tablet 5 mg  5 mg Oral Q6H PRN Laverle Hobby, PA-C      . ibuprofen  (ADVIL,MOTRIN) tablet 600 mg  600 mg Oral Q6H PRN Laverle Hobby, PA-C   600 mg at 04/26/14 7062  . magnesium hydroxide (MILK OF MAGNESIA) suspension 30 mL  30 mL Oral Daily PRN Laverle Hobby, PA-C      . multivitamin with minerals tablet 1 tablet  1 tablet Oral Daily Neita Garnet, MD   1 tablet at 04/26/14 0805  . QUEtiapine (SEROQUEL XR) 24 hr tablet 300 mg  300 mg Oral QPM Laverle Hobby, PA-C   300 mg at 04/25/14 1722  . sertraline (ZOLOFT) tablet 150 mg  150 mg Oral Daily Neita Garnet, MD   150 mg at 04/26/14 0805  . thiamine (VITAMIN B-1) tablet 100 mg  100 mg Oral Daily Neita Garnet, MD   100 mg at 04/26/14 0805  . traZODone (DESYREL) tablet 100 mg  100 mg Oral QHS,MR X 1 Laverle Hobby, PA-C   100 mg at 04/25/14 2154    Lab Results:  Results for orders placed during the hospital encounter of 04/24/14 (from the past 48 hour(s))  TSH     Status: None   Collection Time    04/25/14  6:15 AM      Result Value Ref Range   TSH 2.420  0.350 - 4.500 uIU/mL   Comment: Performed at [place] PANEL     Status: Abnormal   Collection Time    04/25/14  6:15 AM      Result Value Ref Range   Total Protein 8.3  6.0 - 8.3 g/dL   Albumin 3.7  3.5 - 5.2 g/dL   AST 176 (*) 0 - 37 U/L   ALT 134 (*) 0 - 35 U/L   Alkaline Phosphatase 90  39 - 117 U/L   Total Bilirubin 0.9  0.3 - 1.2 mg/dL   Bilirubin, Direct 0.2  0.0 - 0.3 mg/dL   Indirect Bilirubin 0.7  0.3 - 0.9 mg/dL   Comment: Performed at [facility]  LIPID PANEL     Status: Abnormal   Collection Time    04/25/14  6:15 AM      Result Value Ref Range   Cholesterol 135  0 - 200 mg/dL   Triglycerides 154 (*) <150 mg/dL   HDL 35 (*) >39 mg/dL   Total CHOL/HDL Ratio 3.9     VLDL 31  0 - 40 mg/dL   LDL Cholesterol 69  0 - 99 mg/dL   Comment:            Total Cholesterol/HDL:CHD Risk     Coronary Heart Disease Risk Table                         Men   Women      1/2 Average Risk   3.4    3.3      Average Risk       5.0   4.4      2 X Average Risk   9.6   7.1      3 X  Average Risk  23.4   11.0                Use the calculated Patient Ratio     above and the CHD Risk Table     to determine the patient's CHD Risk.                ATP III CLASSIFICATION (LDL):      <100     mg/dL   Optimal      100-129  mg/dL   Near or Above                        Optimal      130-159  mg/dL   Borderline      160-189  mg/dL   High      >190     mg/dL   Very High     Performed at [facility]  TSH     Status: None   Collection Time    04/26/14  6:25 AM      Result Value Ref Range   TSH 1.450  0.350 - 4.500 uIU/mL   Comment: Performed at [facility]    Physical Findings: AIMS: Facial and Oral Movements Muscles of Facial Expression: None, normal Lips and Perioral Area: None, normal Jaw: None, normal Tongue: None, normal,Extremity Movements Upper (arms, wrists, hands, fingers): None, normal Lower (legs, knees, ankles, toes): None, normal, Trunk Movements Neck, shoulders, hips: None, normal, Overall Severity Severity of abnormal movements (highest score from questions above): None, normal Incapacitation due to abnormal movements: None, normal Patient's awareness of abnormal movements (rate only patient's report): No Awareness, Dental Status Current problems with teeth and/or dentures?: No Does patient usually wear dentures?: No  CIWA:    COWS:     Treatment Plan Summary: Daily contact with patient to assess and evaluate symptoms and progress in treatment Medication management See below  Assessment: At this time patient remains depressed and somewhat dysphoric, but appears better than upon admission and is currently denying any SI. Reports some occasional auditory hallucinations but does not appear psychotic or disorganized. Does have PTSD related nightmares and memories, relating to son's death in Jul 07, 2013.  Tolerating Zoloft and Seroquel XR well thus  far. Agrees to Minipress trial to address nightmares.  Plan: Continue inpatient treatment/ milieu/support Continue Seroquel XR 300 mgrs QDAY Continue Zoloft 150 mgrs QDAY Start Minipress 1 mgr QHS- we reviewed side effect profile and rationale. Continue Librium PRNs for possible alcohol withdrawal symptoms.  Medical Decision Making Problem Points:  Established problem, stable/improving (1), Review of last therapy session (1) and Review of psycho-social stressors (1) Data Points:  Review or order clinical lab tests (1) Review of medication regiment & side effects (2) Review of new medications or change in dosage (2)  I certify that inpatient services furnished can reasonably be expected to improve the patient's condition.   COBOS, FERNANDO 04/26/2014, 11:05 AM

## 2014-04-26 NOTE — Progress Notes (Signed)
D: Patient denies SI/HI and A/V hallucinations; patient reports sleep was poor and that she did not rest  A: Monitored q 15 minutes; patient encouraged to attend groups; patient educated about medications; patient given medications per physician orders; patient encouraged to express feelings and/or concerns  R: Patient is assertive; patient is flat and depressed; patient isolates to her room and comes out for meals; patient has been laying in the bed awake; patient's interaction with staff and peers is very minimal except when asking for something; patient was able to set goal to talk with staff 1:1 when having feelings of SI; patient is taking medications as prescribed and tolerating medications; patient is attending some groups

## 2014-04-26 NOTE — BHH Group Notes (Signed)
[place] LCSW Group Therapy  [facility] 1:15 - 2:30 PM  04/26/2014   Type of Therapy:  Group Therapy  Participation Level: Minimal  Participation Quality:  Attentive  Affect:  Appropriate  Cognitive:  Appropriate  Insight:  Developing/Improving   Engagement in Therapy:  Developing/Improving   Modes of Intervention:  Discussion, Education, Exploration, Problem-Solving, Rapport Building, Support   Summary of Progress/Problems:   Patient was attentive to speaker from the [employer] [facility] [employer] as he shared his story of dealing with mental health/substance abuse issues and overcoming it by working a recovery program.  Patient expressed interest in their programs and services and received information on their agency.    [name] 04/26/2014

## 2014-04-26 NOTE — BHH Suicide Risk Assessment (Signed)
[place] INPATIENT:  Family/Significant Other Suicide Prevention Education  Suicide Prevention Education:  Education Completed; Christene Slates, Friend, [phone];  has been identified by the patient as the family member/significant other with whom the patient will be residing, and identified as the person(s) who will aid the patient in the event of a mental health crisis (suicidal ideations/suicide attempt).  With written consent from the patient, the family member/significant other has been provided the following suicide prevention education, prior to the and/or following the discharge of the patient.  The suicide prevention education provided includes the following:  Suicide risk factors  Suicide prevention and interventions  National Suicide Hotline telephone number  [facility] assessment telephone number  [facility] Emergency Assistance [place] and/or Residential Mobile Crisis Unit telephone number  Request made of family/significant other to:  Remove weapons (e.g., guns, rifles, knives), all items previously/currently identified as safety concern.   Friend advised that to his knowledge patient does not have access to guns.    Remove drugs/medications (over-the-counter, prescriptions, illicit drugs), all items previously/currently identified as a safety concern.  The family member/significant other verbalizes understanding of the suicide prevention education information provided.  The family member/significant other agrees to remove the items of safety concern listed above.  [name] 04/26/2014, 8:36 AM

## 2014-04-27 MED ORDER — ACAMPROSATE CALCIUM 333 MG PO TBEC
666.0000 mg | DELAYED_RELEASE_TABLET | Freq: Three times a day (TID) | ORAL | Status: DC
  Administered 2014-04-27 – 2014-05-01 (×12): 666 mg via ORAL
  Filled 2014-04-27: qty 84
  Filled 2014-04-27 (×8): qty 2
  Filled 2014-04-27: qty 84
  Filled 2014-04-27 (×7): qty 2
  Filled 2014-04-27: qty 84
  Filled 2014-04-27: qty 2

## 2014-04-27 NOTE — Tx Team (Signed)
Interdisciplinary Treatment Plan Update   Date Reviewed:  04/27/2014  Time Reviewed:  8:28 AM  Progress in Treatment:   Attending groups: Yes Participating in groups: Yes Taking medication as prescribed: Yes  Tolerating medication: Yes Family/Significant other contact made: Yes, collateral contact with boyfriend Patient understands diagnosis: Yes Yes, patient understands diagnosis and need for treatment. Discussing patient identified problems/goals with staff: Yes Yes, patient is able to express goals for treatment and discharge. Medical problems stabilized or resolved: Yes Denies suicidal/homicidal ideation: Yes Patient has not harmed self or others: Yes  For review of initial/current patient goals, please see plan of care.  Estimated Length of Stay:  3-4 days  Reasons for Continued Hospitalization:  Anxiety Depression Medication stabilization   New Problems/Goals identified:    Discharge Plan or Barriers:   Referral made for residential treatment at [facility]  Additional Comments:   MD to continue medication stabilizaton  Patient and CSW reviewed patient's identified goals and treatment plan.  Patient verbalized understanding and agreed to treatment plan.   Attendees:  Patient:  04/27/2014 8:28 AM   Signature:  Gabriel Earing, MD 04/27/2014 8:28 AM  Signature: Carlton Adam, MD 04/27/2014 8:28 AM  Signature: Drake Leach, RN 04/27/2014 8:28 AM  Signature: Talbert Cage, , RN 04/27/2014 8:28 AM  Signature:  Para March, RN 04/27/2014 8:28 AM  Signature:  Joette Catching, LCSW 04/27/2014 8:28 AM  Signature:   04/27/2014 8:28 AM  Signature:  Marijo Sanes Transition Team 04/27/2014 8:28 AM  Signature:  04/27/2014 8:28 AM  Signature:  04/27/2014  8:28 AM  Signature:   Lars Pinks, RN [facility] 04/27/2014  8:28 AM  Signature:  Ridgely Worker LCSW 04/27/2014  8:28 AM    Scribe for Treatment Team:   [employer],  04/27/2014 8:28 AM

## 2014-04-27 NOTE — Progress Notes (Signed)
Psychoeducational Group Note  Date:  04/27/2014 Time:  2257  Group Topic/Focus:  Wrap-Up Group:   The focus of this group is to help patients review their daily goal of treatment and discuss progress on daily workbooks.  Participation Level: Did Not Attend  Participation Quality:  Not Applicable  Affect:  Not Applicable  Cognitive:  Not Applicable  Insight:  Not Applicable  Engagement in Group: Not Applicable  Additional Comments:  The patient did not attend group this evening since she elected to sleep.   Archie Balboa S 04/27/2014, 10:57 PM

## 2014-04-27 NOTE — BHH Group Notes (Signed)
[place] LCSW Group Therapy  Feelings Around Relapse 1:15 -2:30        04/27/2014   Type of Therapy:  Group Therapy  Participation Level:  Appropriate  Participation Quality:  Appropriate  Affect:  Appropriate  Cognitive:  Attentive Appropriate  Insight:  Developing/Improving  Engagement in Therapy: Developing/Improving  Modes of Intervention:  Discussion Exploration Problem-Solving Supportive  Summary of Progress/Problems:  The topic for today was feelings around relapse.    Patient processed feelings toward relapse and was able to relate to peers. She shared she would wake up in the morning and start drinking.  Patient identified coping skills that can be used to prevent a relapse including removing herself from a negative living environment.[name] 04/27/2014

## 2014-04-27 NOTE — Progress Notes (Addendum)
Pt has been up for some groups today.  She rated all her depression hopelessness and anxiety 8 on her self-inventory. She denies any S/H ideation or V/H however, she does admit to voices off and on calling her name. She denies hearing them here. Her goal "stay more positive" and she went on to say "by learning some coping skills and participating in groups". She is unsure of her follow-up and plans to talk with the doctor and case manager.

## 2014-04-27 NOTE — BHH Group Notes (Signed)
[facility] LCSW Aftercare Discharge Planning Group Note   04/27/2014 1:03 PM    Participation Quality:  Appropraite  Mood/Affect:  Appropriate  Depression Rating:  5  Anxiety Rating:  5  Thoughts of Suicide:  No  Will you contract for safety?   NA  Current AVH:  No  Plan for Discharge/Comments:  Patient attended discharge planning group and actively participated in group.  She advised of feeling a little better.  Patient advised of call from [facility] stating they are considering her for admission next week.     Transportation Means: Patient has transportation.   Supports:  Patient has a support system.   Link Burgeson, Eulas Post

## 2014-04-27 NOTE — Progress Notes (Signed)
Patient ID: Nancy Mckinney, female   DOB: 1971-07-10, 43 y.o.   MRN: [id number] [facility] MD Progress Note  04/27/2014 4:16 PM Nancy Mckinney  MRN:  [id number] Subjective:   Patient reports she remains depressed , but does acknowledge some improvement compared to admission.  She endorses significant alcohol cravings. Objective: I have discussed case with treatment team. I have met with patient. Patient describes partial improvement compared to admission. She states she is still depressed and vaguely anxious , but less than before. She is concerned about alcohol cravings. She describes high motivation in remaining sober. Patient does not want to go back to her prior living situation , which she states is a negative environment and not conducive to improvement or recovery. She is interested in going to inpatient rehab, and as per SW, she may be accepted to [facility] soon. Patient visible in milieu, going to groups, behavior in good control. She denies medication side effects at this time. 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  Respiratory: Negative for cough and shortness of breath.   Cardiovascular: Negative for chest pain.  Gastrointestinal: Negative for heartburn, nausea and vomiting.  Skin: Negative for rash.  Neurological: Negative for dizziness and headaches.  Psychiatric/Behavioral: Positive for depression.    Blood pressure 114/77, pulse 100, temperature 98.4 F (36.9 C), temperature source Oral,  resp. rate 18, height _0  (1.651 m), weight 103.42 kg (228 lb), last menstrual period 03/29/2014, SpO2 95.00%.Body mass index is 37.94 kg/(m^2).  General Appearance: improved grooming   Eye Contact::  Good  Speech:  Normal Rate  Volume:  Normal  Mood:  less depressed, less severely anxious   Affect:  more reactive   Thought Process:  Goal Directed and Linear  Orientation:  Other:  fully alert and attentive  Thought Content:  today not endorsing hallucinations, no delusions expressed   Suicidal Thoughts:  No-at this time denies any suicidal plan or intent and contracts for safety on the unit   Homicidal Thoughts:  No  Memory:  recent and remote grossly intact   Judgement:  Fair  Insight:  Fair  Psychomotor Activity:  Normal  Concentration:  Good  Recall:  Good  Fund of Knowledge:Good  Language: Good  Akathisia:  No  Handed:  Right  AIMS (if indicated):     Assets:  Communication Skills Desire for Improvement Resilience  Sleep:  Number of Hours: 5.75   Musculoskeletal: Strength & Muscle Tone: within normal limits Gait & Station: unsteady Patient leans: N/A  Current Medications: Current Facility-Administered Medications  Medication Dose Route Frequency Provider Last Rate Last Dose  . albuterol (PROVENTIL HFA;VENTOLIN HFA) 108 (90 BASE) MCG/ACT inhaler 1 puff  1 puff Inhalation Q6H PRN Laverle Hobby, PA-C      . alum & mag hydroxide-simeth (MAALOX/MYLANTA) 200-200-20 MG/5ML suspension 30 mL  30 mL Oral Q4H PRN Laverle Hobby, PA-C      . chlordiazePOXIDE (LIBRIUM) capsule 25 mg  25 mg Oral QID PRN Neita Garnet, MD   [phone]  mg at 04/26/14 2032  . haloperidol (HALDOL) tablet 5 mg  5 mg Oral Q6H PRN Laverle Hobby, PA-C   5 mg at 04/27/14 0859  . ibuprofen (ADVIL,MOTRIN) tablet 600 mg  600 mg Oral Q6H PRN Laverle Hobby, PA-C   600 mg at 04/26/14 2054  . magnesium hydroxide (MILK OF MAGNESIA) suspension 30 mL  30 mL Oral Daily PRN Laverle Hobby, PA-C      . multivitamin with  minerals tablet 1 tablet  1 tablet Oral Daily Neita Garnet, MD   1 tablet at 04/27/14 [phone]  . prazosin (MINIPRESS) capsule 1 mg  1 mg Oral QHS Neita Garnet, MD   1 mg at 04/26/14 2054  . QUEtiapine (SEROQUEL XR) 24 hr tablet 300 mg  300 mg Oral QPM Neita Garnet, MD   300 mg at 04/26/14 1828  . sertraline (ZOLOFT) tablet 150 mg  150 mg Oral Daily Neita Garnet, MD   150 mg at 04/27/14 0851  . thiamine (VITAMIN B-1) tablet 100 mg  100 mg Oral Daily Neita Garnet, MD   100 mg at 04/27/14 0786  . traZODone (DESYREL) tablet 100 mg  100 mg Oral QHS,MR X 1 Laverle Hobby, PA-C   100 mg at 04/27/14 7544    Lab Results:  Results for orders placed during the hospital encounter of 04/24/14 (from the past 48 hour(s))  HEMOGLOBIN A1C     Status: None   Collection Time    04/26/14  6:25 AM      Result Value Ref Range   Hemoglobin A1C 5.6  <5.7 %   Comment: (NOTE)                                                                               According to the ADA Clinical Practice Recommendations for 2011, when     HbA1c is used as a screening test:      >=6.5%   Diagnostic of Diabetes Mellitus               (if abnormal result is confirmed)     5.7-6.4%   Increased risk of developing Diabetes Mellitus     References:Diagnosis and Classification of Diabetes Mellitus,Diabetes     BEEF,0071,21(FXJOI 1):S62-S69 and Standards of Medical Care in             Diabetes - 2011,Diabetes TGPQ,9826,41 (Suppl 1):S11-S61.   Mean Plasma Glucose 114  <117 mg/dL   Comment: Performed at [facility]  TSH     Status: None   Collection Time    04/26/14  6:25 AM      Result Value Ref Range   TSH 1.450  0.350 - 4.500 uIU/mL   Comment: Performed at [facility]    Physical Findings: AIMS: Facial and Oral Movements Muscles of Facial Expression: None, normal Lips and Perioral Area: None, normal Jaw: None, normal Tongue: None, normal,Extremity Movements Upper (arms, wrists, hands, fingers):  None, normal Lower (legs, knees, ankles, toes): None, normal, Trunk Movements Neck, shoulders, hips: None, normal, Overall Severity Severity of abnormal movements (highest score from questions above): None, normal Incapacitation due to abnormal movements: None, normal Patient's awareness of abnormal movements (  rate only patient's report): No Awareness, Dental Status Current problems with teeth and/or dentures?: No Does patient usually wear dentures?: No  CIWA:    COWS:     Treatment Plan Summary: Daily contact with patient to assess and evaluate symptoms and progress in treatment Medication management See below  Assessment: At this time patient is improved compared to admission. Less depressed, fuller range of affect. Slept better on Minipress. Tolerating medications well. Interested in [place] to help address significant ETOH cravings.  Plan: Continue inpatient treatment/ milieu/support Continue Seroquel XR 300 mgrs QDAY Continue Zoloft 150 mgrs QDAY Continue Minipress 1 mgr QHS Continue Librium PRNs for possible alcohol withdrawal symptoms. Start Campral 666 mgrs TID  Medical Decision Making Problem Points:  Established problem, stable/improving (1), Review of last therapy session (1) and Review of psycho-social stressors (1) Data Points:  Review of medication regiment & side effects (2) Review of new medications or change in dosage (2)  I certify that inpatient services furnished can reasonably be expected to improve the patient's condition.   Joneric Streight 04/27/2014, 4:16 PM

## 2014-04-27 NOTE — Progress Notes (Signed)
D   Pt is appropriate and pleasant   She reports poor sleep and ability to stay asleep  Pt reports bad cramps due to her menses    A   Verbal support given   Medications administered and effectiveness monitored   Discussed ways to improve sleep and how heat would help relieve cramps    Q 15 min checks R   Pt safe at present

## 2014-04-28 DIAGNOSIS — Z634 Disappearance and death of family member: Secondary | ICD-10-CM

## 2014-04-28 NOTE — Progress Notes (Signed)
D:  Patient's self inventory sheet, patient sleeps good, sleep medication is helpful.  Fair appetite, normal energy level, good concentration.  Rated depression 5, hopeless 6, anxiety 7.  Withdrawals in the past 24 hours, cravings.  Denied SI.  Denied physical problems.  Denied pain.  Goal is to stay positive.  No discharge plans. A:  Medications administered per MD orders.  Emotional support and encouragement given patient. R:  Denied SI and HI, contracts for safety.  Denied A/V hallucinations.  Safety maintained with 15 minute checks.

## 2014-04-28 NOTE — Progress Notes (Signed)
Patient ID: Nancy Mckinney, female   DOB: 08/20/1970, 43 y.o.   MRN: [id number] [facility] MD Progress Note  04/28/2014 10:25 AM Nancy Mckinney  MRN:  [id number] Subjective:  Patient states she is feeling better but a little tired, which she contributes to the new medications. Patient reports she remains depressed , but does acknowledge some improvement compared to admission.  She endorses decreased alcohol cravings, since starting Campral. Currently rates her depression 6/10, hopelessness 4/10, and anxiety 7/10. Denies any SI/HI/AVH at this time.   Objective: I have discussed case with treatment team.I have met with patient. Patient describes partial improvement compared to admission. She is concerned about alcohol cravings. She describes high motivation in remaining sober.Patient does not want to go back to her prior living situation , which she states is a negative environment and not conducive to improvement or recovery. Upon discharge she is planning to find a shelter she can go to. Patient visible in milieu, going to groups, behavior in good control. She denies medication side effects at this time.    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  Respiratory: Negative for cough and shortness of breath.   Cardiovascular: Negative for chest pain.  Gastrointestinal: Negative for heartburn, nausea and vomiting.  Skin: Negative for rash.  Neurological: Negative for dizziness and headaches.  Psychiatric/Behavioral: Positive for depression.    Blood pressure 115/74,  pulse 98, temperature 98.4 F (36.9 C), temperature source Oral, resp. rate 18, height _0  (1.651 m), weight 103.42 kg (228 lb), last menstrual period 03/29/2014, SpO2 95.00%.Body mass index is 37.94 kg/(m^2).  General Appearance: improved grooming   Eye Contact::  Good  Speech:  Normal Rate  Volume:  Normal  Mood:  less depressed, less severely anxious   Affect:  more reactive   Thought Process:  Goal Directed and Linear  Orientation:  Other:  fully alert and attentive  Thought Content:  today not endorsing hallucinations, no delusions expressed   Suicidal Thoughts:  No-at this time denies any suicidal plan or intent and contracts for safety on the unit   Homicidal Thoughts:  No  Memory:  recent and remote grossly intact   Judgement:  Fair  Insight:  Fair  Psychomotor Activity:  Normal  Concentration:  Good  Recall:  Good  Fund of Knowledge:Good  Language: Good  Akathisia:  No  Handed:  Right  AIMS (if indicated):     Assets:  Communication Skills Desire for Improvement Resilience  Sleep:  Number of Hours: 6.5   Musculoskeletal: Strength & Muscle Tone: within normal limits Gait & Station: unsteady Patient leans: N/A  Current Medications: Current Facility-Administered Medications  Medication Dose Route Frequency Provider Last Rate Last Dose  . acamprosate (CAMPRAL) tablet 666 mg  666 mg Oral TID WC Neita Garnet, MD   666 mg at 04/28/14 4196  . albuterol (PROVENTIL HFA;VENTOLIN HFA) 108 (90 BASE) MCG/ACT inhaler 1 puff  1 puff Inhalation Q6H PRN Laverle Hobby, PA-C      . alum & mag hydroxide-simeth (MAALOX/MYLANTA) 200-200-20 MG/5ML suspension 30 mL  30 mL Oral  Q4H PRN Laverle Hobby, PA-C      . chlordiazePOXIDE (LIBRIUM) capsule 25 mg  25 mg Oral QID PRN Neita Garnet, MD   25 mg at 04/26/14 2032  . ibuprofen (ADVIL,MOTRIN) tablet 600 mg  600 mg Oral Q6H PRN Laverle Hobby, PA-C   600 mg at 04/26/14 2054  . magnesium hydroxide (MILK OF MAGNESIA) suspension 30 mL   30 mL Oral Daily PRN Laverle Hobby, PA-C      . multivitamin with minerals tablet 1 tablet  1 tablet Oral Daily Neita Garnet, MD   1 tablet at 04/28/14 0931  . prazosin (MINIPRESS) capsule 1 mg  1 mg Oral QHS Neita Garnet, MD   1 mg at 04/27/14 2136  . QUEtiapine (SEROQUEL XR) 24 hr tablet 300 mg  300 mg Oral QPM Neita Garnet, MD   300 mg at 04/27/14 2136  . sertraline (ZOLOFT) tablet 150 mg  150 mg Oral Daily Neita Garnet, MD   150 mg at 04/28/14 0930  . thiamine (VITAMIN B-1) tablet 100 mg  100 mg Oral Daily Neita Garnet, MD   100 mg at 04/28/14 0930  . traZODone (DESYREL) tablet 100 mg  100 mg Oral QHS,MR X 1 Laverle Hobby, PA-C   100 mg at 04/27/14 2135    Lab Results:  No results found for this or any previous visit (from the past 48 hour(s)).  Physical Findings: AIMS: Facial and Oral Movements Muscles of Facial Expression: None, normal Lips and Perioral Area: None, normal Jaw: None, normal Tongue: None, normal,Extremity Movements Upper (arms, wrists, hands, fingers): None, normal Lower (legs, knees, ankles, toes): None, normal, Trunk Movements Neck, shoulders, hips: None, normal, Overall Severity Severity of abnormal movements (highest score from questions above): None, normal Incapacitation due to abnormal movements: None, normal Patient's awareness of abnormal movements (rate only patient's report): No Awareness, Dental Status Current problems with teeth and/or dentures?: No Does patient usually wear dentures?: No  CIWA:    COWS:     Treatment Plan Summary: Daily contact with patient to assess and evaluate symptoms and progress in treatment Medication management See below  Assessment: At this time patient is improved compared to admission. Less depressed, fuller range of affect. Slept better on Minipress. Tolerating medications well. .  Plan: Continue inpatient treatment/ milieu/support Continue Seroquel XR 300 mgrs QDAY Continue Zoloft 150 mgrs  QDAY Continue Minipress 1 mgr QHS Continue Librium PRNs for possible alcohol withdrawal symptoms. Continue Campral 666 mgrs TID   Medical Decision Making Problem Points:  Established problem, stable/improving (1), Review of last therapy session (1) and Review of psycho-social stressors (1) Data Points:  Review of medication regiment & side effects (2) Review of new medications or change in dosage (2)  I certify that inpatient services furnished can reasonably be expected to improve the patient's condition.   Priscille Loveless S FNP-BC 04/28/2014, 10:25 AM  Reviewed the information documented and agree with the treatment plan.  Fount Bahe,JANARDHAHA R. 04/29/2014 2:22 PM

## 2014-04-28 NOTE — Progress Notes (Signed)
Writer spoke with patient before she attended AA group this evening and she reported feeling anxious and some cravings. She received a prn librioum. She reported that overall she has had a good day and has been attending groups. She currently denies si/hi/a/v hallucinations. She reports that her social worker is working with her to find a facility for alcohol rehabilitation. She was informed of her scheduled medications and is agreeable to taking them. Support and encouragement given, safety maintained on unit with 15 min checks.

## 2014-04-28 NOTE — Plan of Care (Signed)
Problem: Ineffective individual coping Goal: STG: Patient will remain free from self harm Outcome: Progressing Patient has remained free from self harm as evidenced by 15 min checks.   Problem: Diagnosis: Increased Risk For Suicide Attempt Goal: STG-Patient Will Attend All Groups On The Unit Outcome: Progressing Patient attended AA group this evening and reports that some things discussed were helpful.

## 2014-04-28 NOTE — Progress Notes (Signed)
D   Pt has isolated to her room this evening   She did come out for medications and snack but not for group   Her mood is improved and she isnt as anxious as yesterday   She does report feeling tired and said she did not sleep very good last night and requested extra medication to hopefully help her sleep better tonight A   Verbal support given   Medications discussed and educated on ways to help her sleep   Medications were administered and effectiveness monitored   Q 15 min checks R   Pt safe at present

## 2014-04-28 NOTE — [facility] Group Notes (Signed)
[place] Group Notes: (Clinical Social Work)   04/28/2014      Type of Therapy:  Group Therapy   Participation Level:  Did Not Attend - refused   Selmer Dominion, LCSW 04/28/2014, 12:35 PM

## 2014-04-28 NOTE — Plan of Care (Signed)
Problem: Ineffective individual coping Goal: LTG: Patient will report a decrease in negative feelings Outcome: Progressing Patient currently denies having suicidal thoughts.

## 2014-04-28 NOTE — Progress Notes (Signed)
Patient did attend the evening speaker AA meeting.  

## 2014-04-29 MED ORDER — QUETIAPINE FUMARATE 400 MG PO TABS
400.0000 mg | ORAL_TABLET | Freq: Every day | ORAL | Status: DC
  Administered 2014-04-29: 400 mg via ORAL
  Filled 2014-04-29: qty 2
  Filled 2014-04-29 (×2): qty 1

## 2014-04-29 NOTE — Progress Notes (Signed)
Psychoeducational Group Note  Date:  04/29/2014 Time:  1015  Group Topic/Focus:  Making Healthy Choices:   The focus of this group is to help patients identify negative/unhealthy choices they were using prior to admission and identify positive/healthier coping strategies to replace them upon discharge.  Participation Level:  Active  Participation Quality:  Appropriate  Affect:  Flat  Cognitive:  Oriented  Insight:  Improving  Engagement in Group:  Engaged  Additional Comments:  Pt attended and participated in the group.    Paulino Rily 04/29/2014

## 2014-04-29 NOTE — [facility] Group Notes (Signed)
[place] Group Notes:  (Nursing/MHT/Case Management/Adjunct)  Date:  04/29/2014  Time:  3:14 PM  Type of Therapy: Psychoeducational Skills   Participation Level: Active   Participation Quality: Appropriate   Affect: Appropriate   Cognitive: Appropriate   Insight: Appropriate   Engagement in Group: Engaged   Modes of Intervention: Discussion   Summary of Progress/Problems: Pt did attend self inventory group, pt reported that she was negative SI/HI, no AH/VH noted. Pt rated her depression as a 6, and her helplessness/hopelessness as a 5   Pt reported concerns about her Seroquel, pt advised that the doctor will be made aware.   Benancio Deeds Shanta 04/29/2014, 3:14 PM

## 2014-04-29 NOTE — [facility] Group Notes (Signed)
[facility] Group Notes:  (Clinical Social Work)  04/29/2014  10:00-11:00AM  Summary of Progress/Problems:   The main focus of today's process group was to   1)  discuss the importance of adding supports  2)  define health supports versus unhealthy supports  3)  identify the patient's current unhealthy supports and plan how to handle them  4)  Identify the patient's current healthy supports and plan what to add.  An emphasis was placed on using counselor, doctor, therapy groups, 12-step groups, and problem-specific support groups to expand supports.    The patient expressed full comprehension of the concepts presented, and agreed that there is a need to add more supports.  The patient stated that she wishes she had support from her family, including her mother, sister, aunts and uncles.  She has support from her fiance only.  She talked at length about the negative support of her ex-husband, who calls her an alcoholic but then encourages her to drink.  She feels he also has turned their daughters who are now 59yo and 62yo against her.  She provided much appropriate support to another patient who was asking questions about how to stop an unhealthy support in her life.  Type of Therapy:  Process Group with Motivational Interviewing  Participation Level:  Active  Participation Quality:  Appropriate, Attentive, Sharing and Supportive  Affect:  Blunted  Cognitive:  Alert, Appropriate and Oriented  Insight:  Engaged  Engagement in Therapy:  Engaged  Modes of Intervention:   Education, Support and Processing, Activity  [employer], LCSW 04/29/2014, 12:15pm

## 2014-04-29 NOTE — Progress Notes (Signed)
D) Pt has attended the program and interacts with her peers appropriately. Pt denies SI and HI. Rates her depression at a 5, hopelessness at an 8 and her anxiety at an 8. In a 1:1 Pt talked about learning all the new coping skills but just how very hard it is to put it all into practice. States that she started being a parent at a young age and now that her children are grown she is having problems balancing wanting to recapture her youth. Pt. Talked about her child that committed suicide and how hard it is to go on. A) Given support, reassurance and praise. Encouraged to continue to express her feelings and sadness about her loss. Encouraged to continue to attend and partisapate in the groups and the milieu. R) Denies SI and HI.

## 2014-04-29 NOTE — Progress Notes (Signed)
Patient ID: Nancy Mckinney, female   DOB: December 18, 1970, 43 y.o.   MRN: [id number] Patient ID: Nancy Mckinney, female   DOB: 1971/04/04, 43 y.o.   MRN: [id number] [facility] MD Progress Note  04/29/2014 2:49 PM [name]  MRN:  [id number] Subjective:  Patient states she is feeling better but a little tired, which she contributes to the new medications. Patient reports she remains depressed , but does acknowledge some improvement compared to admission.  She endorses insomnia last night and having mood swings.  She states that she was on Seroquel XR 300 mg for 9 mos and she feels that is not as effective as before.   She endorses decreased alcohol cravings, since starting Campral.  Denies any SI/HI/AVH at this time.   Objective: I have discussed case with treatment team.I have met with patient. Patient describes partial improvement compared to admission. She is concerned about alcohol cravings. She describes high motivation in remaining sober.Patient does not want to go back to her prior living situation , which she states is a negative environment and not conducive to improvement or recovery. Upon discharge she is planning to find a shelter she can go to. Patient visible in milieu, going to groups, behavior in good control.  She is not disruptive on the unit.  She denies medication side effects at this time.    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 Psychiatric: Her speech is normal and behavior is normal. Judgment and thought content normal. Her mood appears anxious. Cognition and memory are normal.    Review of  Systems  Constitutional: Negative.  Negative for fever and chills.  HENT: Negative.   Eyes: Negative.   Respiratory: Negative.  Negative for cough and shortness of breath.   Cardiovascular: Negative.  Negative for chest pain.  Gastrointestinal: Negative.  Negative for heartburn, nausea and vomiting.  Genitourinary: Negative.   Musculoskeletal: Negative.   Skin: Negative.  Negative for rash.  Neurological: Negative.  Negative for dizziness and headaches.  Endo/Heme/Allergies: Negative.   Psychiatric/Behavioral: Positive for depression. Negative for suicidal ideas, hallucinations and substance abuse. The patient is nervous/anxious and has insomnia.     Blood pressure 89/62, pulse 118, temperature 98.6 F (37 C), temperature source Oral, resp. rate 20, height 5' 5" (1.651 m), weight 103.42 kg (228 lb), last menstrual period 03/29/2014, SpO2 95.00%.Body mass index is 37.94 kg/(m^2).  General Appearance: improved grooming   Eye Contact::  Good  Speech:  Normal Rate  Volume:  Normal  Mood:  less depressed, less severely anxious   Affect:  more reactive   Thought Process:  Goal Directed and Linear  Orientation:  Other:  fully alert and attentive  Thought Content:  today not endorsing hallucinations, no delusions expressed   Suicidal Thoughts:  No-at this time denies any suicidal plan or intent and contracts for safety on the unit   Homicidal Thoughts:  No  Memory:  recent and remote grossly intact   Judgement:  Fair  Insight:  Fair  Psychomotor Activity:  Normal  Concentration:  Good  Recall:  Good  Fund of Knowledge:Good  Language: Good  Akathisia:  No  Handed:  Right  AIMS (if indicated):     Assets:  Communication Skills Desire for Improvement Resilience  Sleep:  Number of Hours: 5.75   Musculoskeletal: Strength & Muscle Tone: within normal limits Gait & Station: unsteady Patient leans: N/A  Current Medications: Current Facility-Administered Medications  Medication Dose  Route Frequency Provider Last Rate Last Dose  . acamprosate (CAMPRAL) tablet 666 mg  666 mg Oral TID WC Neita Garnet, MD   666 mg at 04/29/14 5830  . albuterol (PROVENTIL HFA;VENTOLIN HFA) 108 (90 BASE) MCG/ACT inhaler 1 puff  1 puff Inhalation Q6H PRN Laverle Hobby, PA-C      . alum & mag hydroxide-simeth (MAALOX/MYLANTA) 200-200-20 MG/5ML suspension 30 mL  30 mL Oral Q4H PRN Laverle Hobby, PA-C      . chlordiazePOXIDE (LIBRIUM) capsule 25 mg  25 mg Oral QID PRN Neita Garnet, MD   25 mg at 04/29/14 0945  . ibuprofen (ADVIL,MOTRIN) tablet 600 mg  600 mg Oral Q6H PRN Laverle Hobby, PA-C   600 mg at 04/26/14 2054  . magnesium hydroxide (MILK OF MAGNESIA) suspension 30 mL  30 mL Oral Daily PRN Laverle Hobby, PA-C      . multivitamin with minerals tablet 1 tablet  1 tablet Oral Daily Neita Garnet, MD   1 tablet at 04/29/14 9407  . prazosin (MINIPRESS) capsule 1 mg  1 mg Oral QHS Neita Garnet, MD   1 mg at 04/28/14 2130  . QUEtiapine (SEROQUEL XR) 24 hr tablet 300 mg  300 mg Oral QPM Neita Garnet, MD   300 mg at 04/28/14 1809  . sertraline (ZOLOFT) tablet 150 mg  150 mg Oral Daily Neita Garnet, MD   150 mg at 04/29/14 6808  . thiamine (VITAMIN B-1) tablet 100 mg  100 mg Oral Daily Neita Garnet, MD   100 mg at 04/29/14 8110  . traZODone (DESYREL) tablet 100 mg  100 mg Oral QHS,MR X 1 Spencer E Simon, PA-C   100 mg at 04/28/14 2200    Lab Results:  No results found for this or any previous visit (from the past 48 hour(s)).  Physical Findings: AIMS: Facial and Oral Movements Muscles of Facial Expression: None, normal Lips and Perioral Area: None, normal Jaw: None, normal Tongue: None, normal,Extremity Movements Upper (arms, wrists, hands, fingers): None, normal Lower (legs, knees, ankles, toes): None, normal, Trunk Movements Neck, shoulders, hips: None, normal, Overall Severity Severity of abnormal movements (highest score from questions above): None, normal Incapacitation  due to abnormal movements: None, normal Patient's awareness of abnormal movements (rate only patient's report): No Awareness, Dental Status Current problems with teeth and/or dentures?: No Does patient usually wear dentures?: No  CIWA:  CIWA-Ar Total: 1 COWS:  COWS Total Score: 2  Treatment Plan Summary: Daily contact with patient to assess and evaluate symptoms and progress in treatment Medication management See below  Assessment: At this time patient is improved compared to admission. Less depressed, fuller range of affect. She is endorsing mood swings.   She did not sleep well last night. Tolerating medications well. . Plan: Continue inpatient treatment/ milieu/support Continue Seroquel 400 mgrs QHS Continue Zoloft 150 mgrs QDAY Continue Minipress 1 mgr QHS Continue Librium PRNs for possible alcohol withdrawal symptoms. Continue Campral 666 mgrs TID   Medical Decision Making Problem Points:  Established problem, stable/improving (1), Review of last therapy session (1) and Review of psycho-social stressors (1) Data Points:  Review of medication regiment & side effects (2) Review of  new medications or change in dosage (2)  I certify that inpatient services furnished can reasonably be expected to improve the patient's condition.   Kerrie Buffalo MAY, AGNP-BC 04/29/2014, 2:49 PM  Reviewed the information documented and agree with the treatment plan.  ,JANARDHAHA R. 04/30/2014 8:19 AM

## 2014-04-29 NOTE — Progress Notes (Signed)
Patient did attend the evening speaker AA meeting.  

## 2014-04-30 MED ORDER — QUETIAPINE FUMARATE 400 MG PO TABS
400.0000 mg | ORAL_TABLET | Freq: Every day | ORAL | Status: DC
  Administered 2014-04-30: 400 mg via ORAL
  Filled 2014-04-30: qty 14
  Filled 2014-04-30 (×2): qty 1
  Filled 2014-04-30: qty 2

## 2014-04-30 NOTE — B[facility] Group Notes (Signed)
[facility] LCSW Group Therapy          Overcoming Obstacles       1:15 -2:30        04/30/2014   3:26 PM    Type of Therapy:  Group Therapy  Participation Level:  Appropriate  Participation Quality:  Appropriate  Affect:  Appropriate, Alert  Cognitive:  Attentive Appropriate  Insight: Developing/Improving Engaged  Engagement in Therapy: Developing/Imprvoing Engaged  Modes of Intervention:  Discussion Exploration  Education Rapport BuildingProblem-Solving Support  Summary of Progress/Problems:  The main focus of today's group was overcoming obstacles. Patient shared the obstacle she has to overcome is family relationship.  Patient stated she plans to make the most of her treatment at [facility] should she be accepted.  She stated she has spend most of her life taking care of others and believes now is the time to take care of herself.  She advised her daughters are very disrespectful to her and its hurts.  She stated upon completion of program at [facility] her plans is to leave the area for a while and spend time getting her life together before making contact with family again.  [name] 04/30/2014   3:26 PM

## 2014-04-30 NOTE — Progress Notes (Signed)
[facility] Group Notes:  (Nursing/MHT/Case Management/Adjunct)  Date:  04/30/2014  Time:  2:54 PM  Type of Therapy:  Therapeutic Activity  Participation Level:  Active  Participation Quality:  Appropriate  Affect:  Appropriate  Cognitive:  Appropriate  Insight:  Appropriate  Engagement in Group:  Engaged  Modes of Intervention:  Activity  Summary of Progress/Problems: Pts played "Leisure ABC's." Pts identified positive, healthy activities that correspond with each letter of the alphabet.  Clint Bolder 04/30/2014, 2:54 PM

## 2014-04-30 NOTE — Progress Notes (Signed)
Patient ID: Nancy Mckinney, female   DOB: 25-Dec-1970, 43 y.o.   MRN: [id number] [facility] MD Progress Note  04/30/2014 2:50 PM Nancy Mckinney  MRN:  [id number] Subjective:  Patient reports improvement, and states that she is less depressed. She wants to go to a Rehab after discharge.  Objective: Patient is improved compared to admission.She is less depressed, less constricted in affect, less ruminative about her deceased son.  Symptoms of PTSD have improved and states she has slept better and has had no significant nightmares since she was started on Minipress . She is not currently presenting with any withdrawal symptoms, and her vitals are stable. She is visible in milieu, and has been going to groups.  She is tolerating medications well, which at this time include Campral, Seroquel ( which was increased over the weekend) , Minipress and Zoloft. She does state that Seroquel, although helpful and well tolerated, causes next AM sedation, and is hoping to adjust time of administration of this medication to earlier in the evening. As discussed with staff, SW is currently working on disposition plan of patient going to inpatient rehab after discharge.    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 Psychiatric: Her speech is normal and behavior is normal. Judgment and thought content normal. Her mood appears anxious. Cognition and memory are normal.    Review of Systems  Constitutional: Negative.  Negative for fever and chills.  HENT: Negative.   Eyes: Negative.   Respiratory: Negative.  Negative for cough and  shortness of breath.   Cardiovascular: Negative.  Negative for chest pain.  Gastrointestinal: Negative.  Negative for heartburn, nausea and vomiting.  Genitourinary: Negative.   Musculoskeletal: Negative.   Skin: Negative.  Negative for rash.  Neurological: Negative.  Negative for dizziness and headaches.  Endo/Heme/Allergies: Negative.   Psychiatric/Behavioral: Positive for depression. Negative for suicidal ideas, hallucinations and substance abuse. The patient is nervous/anxious and has insomnia.     Blood pressure 129/78, pulse 83, temperature 97.5 F (36.4 C), temperature source Oral, resp. rate 18, height 5\' 5"  (1.651 m), weight 103.42 kg (228 lb), last menstrual period 03/29/2014, SpO2 95.00%.Body mass index is 37.94 kg/(m^2).  General Appearance: improved grooming   Eye Contact::  Good  Speech:  Normal Rate  Volume:  Normal  Mood:  less depressed   Affect:  more reactive   Thought Process:  Goal Directed and Linear  Orientation:  Other:  fully alert and attentive  Thought Content:  today not endorsing hallucinations, no delusions expressed   Suicidal Thoughts:  No-at this time denies any suicidal plan or intent and contracts for safety on the unit   Homicidal Thoughts:  No  Memory:  recent and remote grossly intact   Judgement:  Other:  improved   Insight:  Fair  Psychomotor Activity:  Normal  Concentration:  Good  Recall:  Good  Fund of Knowledge:Good  Language: Good  Akathisia:  No  Handed:  Right  AIMS (if indicated):     Assets:  Communication Skills Desire for Improvement Resilience  Sleep:  Number of Hours: 6   Musculoskeletal: Strength & Muscle Tone: within normal  limits Gait & Station: unsteady Patient leans: N/A  Current Medications: Current Facility-Administered Medications  Medication Dose Route Frequency Provider Last Rate Last Dose  . acamprosate (CAMPRAL) tablet 666 mg  666 mg Oral TID WC Neita Garnet, MD   666 mg at 04/30/14 1203  . albuterol  (PROVENTIL HFA;VENTOLIN HFA) 108 (90 BASE) MCG/ACT inhaler 1 puff  1 puff Inhalation Q6H PRN Laverle Hobby, PA-C   1 puff at 04/30/14 0951  . alum & mag hydroxide-simeth (MAALOX/MYLANTA) 200-200-20 MG/5ML suspension 30 mL  30 mL Oral Q4H PRN Laverle Hobby, PA-C      . chlordiazePOXIDE (LIBRIUM) capsule 25 mg  25 mg Oral QID PRN Neita Garnet, MD   25 mg at 04/30/14 6761  . ibuprofen (ADVIL,MOTRIN) tablet 600 mg  600 mg Oral Q6H PRN Laverle Hobby, PA-C   600 mg at 04/26/14 2054  . magnesium hydroxide (MILK OF MAGNESIA) suspension 30 mL  30 mL Oral Daily PRN Laverle Hobby, PA-C      . multivitamin with minerals tablet 1 tablet  1 tablet Oral Daily Neita Garnet, MD   1 tablet at 04/30/14 9509  . prazosin (MINIPRESS) capsule 1 mg  1 mg Oral QHS Neita Garnet, MD   1 mg at 04/29/14 2113  . QUEtiapine (SEROQUEL) tablet 400 mg  400 mg Oral QHS Neita Garnet, MD      . sertraline (ZOLOFT) tablet 150 mg  150 mg Oral Daily Neita Garnet, MD   150 mg at 04/30/14 0807  . thiamine (VITAMIN B-1) tablet 100 mg  100 mg Oral Daily Neita Garnet, MD   100 mg at 04/30/14 0807  . traZODone (DESYREL) tablet 100 mg  100 mg Oral QHS,MR X 1 Laverle Hobby, PA-C   100 mg at 04/29/14 2331    Lab Results:  No results found for this or any previous visit (from the past 48 hour(s)).  Physical Findings: AIMS: Facial and Oral Movements Muscles of Facial Expression: None, normal Lips and Perioral Area: None, normal Jaw: None, normal Tongue: None, normal,Extremity Movements Upper (arms, wrists, hands, fingers): None, normal Lower (legs, knees, ankles, toes): None, normal, Trunk Movements Neck, shoulders, hips: None, normal, Overall Severity Severity of abnormal movements (highest score from questions above): None, normal Incapacitation due to abnormal movements: None, normal Patient's awareness of abnormal movements (rate only patient's report): No Awareness, Dental Status Current problems with teeth  and/or dentures?: No Does patient usually wear dentures?: No  CIWA:  CIWA-Ar Total: 1 COWS:  COWS Total Score: 2  Treatment Plan Summary: Daily contact with patient to assess and evaluate symptoms and progress in treatment Medication management See below  Assessment: Patient continuing to improve, tolerating medications well except for some sedation in the morning , which she feels is related to seroquel  QHS dose carrying over to AM. She is not presenting with any withdrawal symptoms. She is interactive and appropriate in milieu.  . Plan: Continue inpatient treatment/ milieu/support Seroquel 400 mgrs QHS Zoloft 150 mgrs QDAY Minipress 1 mgr QHS Campral 666 mgrs TID  As discussed with staff/SW, patient is currently being considered for a bed at [facility] inpatient rehab. Information regarding bed availability expected for tomorrow in AM.   Medical Decision Making Problem Points:  Established problem, stable/improving (1), Review of last therapy session (1) and Review of psycho-social stressors (1) Data Points:  Review of medication regiment & side effects (2)  I certify that inpatient services furnished can reasonably be expected to improve  the patient's condition.    COBOS, FERNANDO 04/30/2014 2:50 PM

## 2014-04-30 NOTE — Progress Notes (Signed)
Adult Psychoeducational Group Note  Date:  04/30/2014 Time:  10:00AM  Group Topic/Focus:  Dimensions of Wellness:   The focus of this group is to introduce the topic of wellness and discuss the role each dimension of wellness plays in total health.  Participation Level:  Active  Participation Quality:  Appropriate  Affect:  Appropriate  Cognitive:  Appropriate  Insight: Appropriate  Engagement in Group:  Engaged  Modes of Intervention:  Activity  Additional Comments:  Pt was active throughout group and participated in the group activity. Pt played "Wellness Jeopardy" with peers   Aayra Hornbaker K 04/30/2014, 10:20 AM

## 2014-04-30 NOTE — Plan of Care (Signed)
Problem: Alteration in mood Goal: LTG-Patient reports reduction in suicidal thoughts (Patient reports reduction in suicidal thoughts and is able to verbalize a safety plan for whenever patient is feeling suicidal)  Outcome: Progressing Patient denies suicidal thoughts.

## 2014-04-30 NOTE — Progress Notes (Signed)
Writer spoke with patient 1:1 and she reports having had a good day and feels a little bit better today. She has been observed sitting in the day room interacting appropriately with peers. She is made aware of her seroquel being increased and to be given with her hs medications. She is hopeful that she will sleep well tonight. She denies si/hi/a/v hallucinations. Support and encouragement given, safety maintained on unit with 15 min checks. She is hopeful to discharge on tomorrow.

## 2014-04-30 NOTE — Plan of Care (Signed)
Problem: Diagnosis: Increased Risk For Suicide Attempt Goal: STG-Patient Will Attend All Groups On The Unit Outcome: Progressing Patient attended AA group this evening.

## 2014-04-30 NOTE — Plan of Care (Signed)
Problem: Diagnosis: Increased Risk For Suicide Attempt Goal: STG-Patient Will Comply With Medication Regime Outcome: Progressing Patient is compliant with her scheduled hs medications.

## 2014-04-30 NOTE — Progress Notes (Signed)
Patient was seen coming out of a female patients room.  Writer confronted patient and asked her what happened and patient became confrontational and verbally aggressive.  Patient was told the rules and states she understood.

## 2014-04-30 NOTE — Progress Notes (Signed)
D: Pt presents with flat affect and depressed mood. Pt denies SI/HI/AVH. Pt rates depression 5/10. Hopeless 5/10. Anxiety 8/10. Pt reports withdrawal symptoms of sweats and tremors. Pt given librium for withdrawal symptoms. Pt compliant with taking meds and attending groups.  A: Medications administered as ordered per MD. Verbal support given. Pt encouraged to attend groups. 15 minute checks performed for safety.  R: Pt safety  Maintained at this time. Pt receptive to treatment.

## 2014-05-01 DIAGNOSIS — F4310 Post-traumatic stress disorder, unspecified: Secondary | ICD-10-CM

## 2014-05-01 DIAGNOSIS — F329 Major depressive disorder, single episode, unspecified: Secondary | ICD-10-CM

## 2014-05-01 DIAGNOSIS — F1421 Cocaine dependence, in remission: Secondary | ICD-10-CM

## 2014-05-01 DIAGNOSIS — F1020 Alcohol dependence, uncomplicated: Secondary | ICD-10-CM

## 2014-05-01 MED ORDER — ACAMPROSATE CALCIUM 333 MG PO TBEC: 666.0000 mg | DELAYED_RELEASE_TABLET | Freq: Three times a day (TID) | ORAL | Status: DC

## 2014-05-01 MED ORDER — TRAZODONE HCL 100 MG PO TABS: 100.0000 mg | ORAL_TABLET | Freq: Every evening | ORAL | Status: DC | PRN

## 2014-05-01 MED ORDER — PRAZOSIN HCL 1 MG PO CAPS: 1.0000 mg | ORAL_CAPSULE | Freq: Every day | ORAL | Status: DC

## 2014-05-01 MED ORDER — SERTRALINE HCL 50 MG PO TABS: 150.0000 mg | ORAL_TABLET | Freq: Every day | ORAL | Status: DC

## 2014-05-01 MED ORDER — HYDROXYZINE HCL 25 MG PO TABS: 25.0000 mg | ORAL_TABLET | Freq: Four times a day (QID) | ORAL | Status: DC | PRN

## 2014-05-01 MED ORDER — QUETIAPINE FUMARATE 400 MG PO TABS: 400.0000 mg | ORAL_TABLET | Freq: Every day | ORAL | Status: DC

## 2014-05-01 MED ORDER — HYDROXYZINE HCL 25 MG PO TABS
25.0000 mg | ORAL_TABLET | Freq: Four times a day (QID) | ORAL | Status: DC | PRN
  Administered 2014-05-01: 25 mg via ORAL
  Filled 2014-05-01: qty 1
  Filled 2014-05-01: qty 42

## 2014-05-01 NOTE — [facility] Suicide Risk Assessment (Signed)
Demographic Factors:  43 year old female, separated, has 5 surviving children  Total Time spent with patient: 30 minutes  Psychiatric Specialty Exam: Physical Exam  ROS  Blood pressure 93/56, pulse 105, temperature 97.7 F (36.5 C), temperature source Oral, resp. rate 18, height 5\' 5"  (1.651 m), weight 103.42 kg (228 lb), last menstrual period 03/29/2014, SpO2 95.00%.Body mass index is 37.94 kg/(m^2).  General Appearance: Well Groomed  Engineer, water::  Good  Speech:  Clear and Coherent  Volume:  Normal  Mood:  improved, at this time denies depression  Affect:  Appropriate  Thought Process:  Goal Directed and Linear  Orientation:  Full (Time, Place, and Person)  Thought Content:  no hallucinations,no delusions  Suicidal Thoughts:  No  Homicidal Thoughts:  No  Memory:  recent and remote grossly intact   Judgement:  Other:  improved   Insight:  Fair  Psychomotor Activity:  Normal  Concentration:  Good  Recall:  Good  Fund of Knowledge:Good  Language: Good  Akathisia:  Negative  Handed:  Right  AIMS (if indicated):     Assets:  Communication Skills Desire for Improvement Resilience  Sleep:  Number of Hours: 6    Musculoskeletal: Strength & Muscle Tone: within normal limits Gait & Station: normal Patient leans:NA    Mental Status Per Nursing Assessment::   On Admission:  Self-harm thoughts;Belief that plan would result in death  Current Mental Status by Physician: At this time improved mood, affect, no thought disorder, no SI or HI, no psychotic symptoms, less ruminative, future oriented.  Loss Factors: housing difficulties, death of son in 07-05-23  Historical Factors: History of depression, history of substance abuse, no history of suicide attempts, one  Prior psychiatric admission.   Risk Reduction Factors:   Sense of responsibility to family and Positive coping skills or problem solving skills  Continued Clinical Symptoms:  At this time patient is much  improved, with improved mood, affect, no thought disorder, no suicidal or homicidal ideations, no psychotic symptoms. She is future oriented. No ongoing symptoms of withdrawal.  Cognitive Features That Contribute To Risk:  No gross cognitive deficits noted upon discharge- patient is fully alert, attentive,and oriented x 3  Suicide Risk:  Mild:  Suicidal ideation of limited frequency, intensity, duration, and specificity.  There are no identifiable plans, no associated intent, mild dysphoria and related symptoms, good self-control (both objective and subjective assessment), few other risk factors, and identifiable protective factors, including available and accessible social support.  Discharge Diagnoses:   AXIS I: Major Depression,  PTSD , Alcohol Dependence, Cocaine Abuse in remission AXIS II:  Deferred AXIS III:   Past Medical History  Diagnosis Date  . Asthma   . Bipolar 1 disorder   . Irritable bowel syndrome   . Depression    AXIS IV:  Housing difficulties, unemployment, death of son by suicide in 2023-07-05 AXIS V:  61-70 mild symptoms  Plan Of Care/Follow-up recommendations:  Activity:  As tolerated Diet:  Regular Tests:  NA Other:  See below  Is patient on multiple antipsychotic therapies at discharge:  No   Has Patient had three or more failed trials of antipsychotic monotherapy by history:  No  Recommended Plan for Multiple Antipsychotic Therapies: NA  Patient is leaving unit in good spirits. Follow Up as Below: Follow-up Information    Follow up with [facility]. (Patient will scheduled with [facility] upon completion of treatment with [facility])    Contact information:    [address]  [place], [place] [address]  [phone]       Follow up with [facility] On 05/01/2014. (Patient to be transported to [facility] today by their staff)    Contact information:    [place], [place] [address]  [place], [place] 05/01/2014, 12:38 PM

## 2014-05-01 NOTE — Plan of Care (Signed)
Problem: Ineffective individual coping Goal: STG: Pt will be able to identify effective and ineffective STG: Pt will be able to identify effective and ineffective coping patterns  Outcome: Progressing Pt attends scheduled groups.  Goal: STG: Patient will remain free from self harm Outcome: Progressing Pt denies SI and contracts for safety.

## 2014-05-01 NOTE — Progress Notes (Signed)
[facility] Adult Case Management Discharge Plan :  Will you be returning to the same living situation after discharge: No.  Patient is discharging to [facility] At discharge, do you have transportation home?:Yes,  [facility] to provide transportation. Do you have the ability to pay for your medications:No.  Patient needs assistance with indigent medications.  Release of information consent forms completed and in the chart;  Patient's signature needed at discharge.  Patient to Follow up at: Follow-up Information   Follow up with [facility]. (Patient will scheduled with [facility] upon completion of treatment with [facility])    Contact information:   [address] [place], [place]   [address]  [phone]      Follow up with [facility] On 05/01/2014. (Patient to be transported to [facility] today by their staff)    Contact information:   [place], [place]  [phone]      Patient denies SI/HI:  Patient no longer endorsing SI/HI or other thoughts of self harm.  Safety Planning and Suicide Prevention discussed: .Reviewed with all patients during discharge planning group  Alaney Witter, [place] Post 05/01/2014, 10:23 AM
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
## 2014-05-01 NOTE — Progress Notes (Signed)
D: Pt presents with flat affect and depressed mood. Pt has minimal interaction on the milieu. Pt expressed feelings of anger d/t the passing of her son, last year he committed suicide. Pt denies SI/HI/AVH. Pt rates depression 7/10 and hopeless 7/10. Pt expressed feeling anxious this morning and requested anxiety for medicine. Pt given prn med as requested.  A: Medications administered as ordered per MD. Verbal support given. Pt encouraged to attend groups. 15 minute checks performed for safety.  R: Pt safety maintained. Pt receptive to tx. Pt stated that she is not ready to face the world. Pt needs more time to work on decreasing depression.

## 2014-05-01 NOTE — Progress Notes (Signed)
Discharge note: Pt received both written and verbal discharge instructions. Pt verbalized understanding of discharge instructions. Pt agreed to f/u appt and med regimen. Pt denies SI/HI/DEP. Pt received belongings, sample meds and prescriptions. Pt safely left [facility] and picked up by [facility].
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
## 2014-05-01 NOTE — Progress Notes (Signed)
D   Pt came to the medication window early and said she wanted her medications so she could sleep and wake in the morning and go home    She said she was ready for discharge and denies suicidal ideation A   Verbal support given   Medications administered and effectiveness monitored    Q 15 min checks  R   Pt safe at present

## 2014-05-03 NOTE — Progress Notes (Signed)
Patient Discharge Instructions:  After Visit Summary (AVS):   Faxed to:  05/03/14 Psychiatric Admission Assessment Note:   Faxed to:  05/03/14 Suicide Risk Assessment - Discharge Assessment:   Faxed to:  05/03/14 Faxed/Sent to the Next Level Care provider:  05/03/14 Faxed to [facility] @ [phone] Faxed to [facility] @ [place], 05/03/2014, 4:02 PM
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
## 2014-06-01 ENCOUNTER — Emergency Department (HOSPITAL_COMMUNITY): Payer: Self-pay

## 2014-06-01 ENCOUNTER — Emergency Department (HOSPITAL_COMMUNITY)
Admission: EM | Admit: 2014-06-01 | Discharge: 2014-06-01 | Disposition: A | Discharge status: Home / Self Care | Payer: Self-pay | Attending: Emergency Medicine | Admitting: Emergency Medicine

## 2014-06-01 ENCOUNTER — Encounter (HOSPITAL_COMMUNITY): Payer: Self-pay | Admitting: *Deleted

## 2014-06-01 DIAGNOSIS — Z79899 Other long term (current) drug therapy: Secondary | ICD-10-CM | POA: Insufficient documentation

## 2014-06-01 DIAGNOSIS — Y9289 Other specified places as the place of occurrence of the external cause: Secondary | ICD-10-CM | POA: Insufficient documentation

## 2014-06-01 DIAGNOSIS — T07XXXA Unspecified multiple injuries, initial encounter: Secondary | ICD-10-CM

## 2014-06-01 DIAGNOSIS — Y998 Other external cause status: Secondary | ICD-10-CM | POA: Insufficient documentation

## 2014-06-01 DIAGNOSIS — Z23 Encounter for immunization: Secondary | ICD-10-CM | POA: Insufficient documentation

## 2014-06-01 DIAGNOSIS — S30810A Abrasion of lower back and pelvis, initial encounter: Secondary | ICD-10-CM | POA: Insufficient documentation

## 2014-06-01 DIAGNOSIS — Y9389 Activity, other specified: Secondary | ICD-10-CM | POA: Insufficient documentation

## 2014-06-01 DIAGNOSIS — S60812A Abrasion of left wrist, initial encounter: Secondary | ICD-10-CM | POA: Insufficient documentation

## 2014-06-01 DIAGNOSIS — S52121A Displaced fracture of head of right radius, initial encounter for closed fracture: Secondary | ICD-10-CM | POA: Insufficient documentation

## 2014-06-01 DIAGNOSIS — J45909 Unspecified asthma, uncomplicated: Secondary | ICD-10-CM | POA: Insufficient documentation

## 2014-06-01 DIAGNOSIS — S8012XA Contusion of left lower leg, initial encounter: Secondary | ICD-10-CM | POA: Insufficient documentation

## 2014-06-01 DIAGNOSIS — Z3202 Encounter for pregnancy test, result negative: Secondary | ICD-10-CM | POA: Insufficient documentation

## 2014-06-01 DIAGNOSIS — Z8719 Personal history of other diseases of the digestive system: Secondary | ICD-10-CM | POA: Insufficient documentation

## 2014-06-01 DIAGNOSIS — M25521 Pain in right elbow: Secondary | ICD-10-CM

## 2014-06-01 DIAGNOSIS — F319 Bipolar disorder, unspecified: Secondary | ICD-10-CM | POA: Insufficient documentation

## 2014-06-01 LAB — URINALYSIS, ROUTINE W REFLEX MICROSCOPIC
Bilirubin Urine: NEGATIVE
Glucose, UA: NEGATIVE mg/dL
HGB URINE DIPSTICK: NEGATIVE
Ketones, ur: NEGATIVE mg/dL
Leukocytes, UA: NEGATIVE
NITRITE: NEGATIVE
PROTEIN: 30 mg/dL — AB
SPECIFIC GRAVITY, URINE: 1.013 (ref 1.005–1.030)
UROBILINOGEN UA: 1 mg/dL (ref 0.0–1.0)
pH: 6 (ref 5.0–8.0)

## 2014-06-01 LAB — URINE MICROSCOPIC-ADD ON

## 2014-06-01 LAB — PREGNANCY, URINE: Preg Test, Ur: NEGATIVE

## 2014-06-01 MED ORDER — HYDROCODONE-ACETAMINOPHEN 5-325 MG PO TABS: 1.0000 | ORAL_TABLET | Freq: Four times a day (QID) | ORAL | Status: DC | PRN

## 2014-06-01 MED ORDER — TETANUS-DIPHTH-ACELL PERTUSSIS 5-2.5-18.5 LF-MCG/0.5 IM SUSP
0.5000 mL | Freq: Once | INTRAMUSCULAR | Status: AC
  Administered 2014-06-01: 0.5 mL via INTRAMUSCULAR
  Filled 2014-06-01: qty 0.5

## 2014-06-01 MED ORDER — ALBUTEROL SULFATE HFA 108 (90 BASE) MCG/ACT IN AERS
2.0000 | INHALATION_SPRAY | Freq: Once | RESPIRATORY_TRACT | Status: AC
  Administered 2014-06-01: 2 via RESPIRATORY_TRACT
  Filled 2014-06-01: qty 6.7

## 2014-06-01 MED ORDER — OXYCODONE-ACETAMINOPHEN 5-325 MG PO TABS
1.0000 | ORAL_TABLET | Freq: Once | ORAL | Status: AC
  Administered 2014-06-01: 1 via ORAL
  Filled 2014-06-01: qty 1
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
## 2014-06-01 NOTE — ED Notes (Signed)
Patient transported to X-ray 
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
## 2014-11-15 ENCOUNTER — Emergency Department (HOSPITAL_COMMUNITY)
Admission: EM | Admit: 2014-11-15 | Discharge: 2014-11-15 | Discharge status: Against Medical Advice | Payer: Self-pay | Attending: Emergency Medicine | Admitting: Emergency Medicine

## 2014-11-15 ENCOUNTER — Encounter (HOSPITAL_COMMUNITY): Payer: Self-pay | Admitting: Nurse Practitioner

## 2014-11-15 DIAGNOSIS — Z008 Encounter for other general examination: Secondary | ICD-10-CM | POA: Insufficient documentation

## 2014-11-15 DIAGNOSIS — J45909 Unspecified asthma, uncomplicated: Secondary | ICD-10-CM | POA: Insufficient documentation

## 2014-11-15 NOTE — ED Notes (Signed)
Pt left AMA, went to round on her to explain the delay she was not in the room. PA-C notified.
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
## 2014-11-16 ENCOUNTER — Emergency Department (HOSPITAL_COMMUNITY)
Admission: EM | Admit: 2014-11-16 | Discharge: 2014-11-17 | Disposition: A | Discharge status: Home / Self Care | Payer: Self-pay | Attending: Emergency Medicine | Admitting: Emergency Medicine

## 2014-11-16 DIAGNOSIS — F319 Bipolar disorder, unspecified: Secondary | ICD-10-CM | POA: Insufficient documentation

## 2014-11-16 DIAGNOSIS — J45901 Unspecified asthma with (acute) exacerbation: Secondary | ICD-10-CM | POA: Insufficient documentation

## 2014-11-16 DIAGNOSIS — Z8719 Personal history of other diseases of the digestive system: Secondary | ICD-10-CM | POA: Insufficient documentation

## 2014-11-16 MED ORDER — PREDNISONE 20 MG PO TABS: 60.0000 mg | ORAL_TABLET | Freq: Every day | ORAL | Status: DC

## 2014-11-16 MED ORDER — ALBUTEROL SULFATE HFA 108 (90 BASE) MCG/ACT IN AERS
1.0000 | INHALATION_SPRAY | RESPIRATORY_TRACT | Status: DC | PRN
  Administered 2014-11-17: 1 via RESPIRATORY_TRACT
  Filled 2014-11-16: qty 6.7

## 2014-11-16 MED ORDER — ALBUTEROL SULFATE HFA 108 (90 BASE) MCG/ACT IN AERS: 1.0000 | INHALATION_SPRAY | RESPIRATORY_TRACT | Status: AC | PRN

## 2014-11-16 NOTE — ED Provider Notes (Signed)
CSN: [id number]     Arrival date & time 11/16/14  2303 History  This chart was scribed for Nancy Flemings, MD by Evelene Croon, ED Scribe. This patient was seen in room [facility] and the patient's care was started 11:54 PM.    Chief Complaint  Patient presents with  . Shortness of Breath     The history is provided by the patient. No language interpreter was used.     HPI Comments:  Malai Lady is a 44 y.o. female with a history of asthma who presents to the Emergency Department complaining of constant trouble breathing that has progressively worsened since onset 2 days ago. She reports associated cough which also worsened today and pounding HA. No alleviating factors noted, pt states she lost her inhaler.   Past Medical History  Diagnosis Date  . Asthma   . Bipolar 1 disorder   . Irritable bowel syndrome   . Depression    Past Surgical History  Procedure Laterality Date  . Abdominal surgery     No family history on file. History  Substance Use Topics  . Smoking status: Never Smoker   . Smokeless tobacco: Not on file  . Alcohol Use: Yes     Comment: had 1 can of beer today.    OB History    No data available     Review of Systems  Constitutional: Negative for fever and chills.  Respiratory: Positive for cough and shortness of breath.   Neurological: Positive for headaches.  All other systems reviewed and are negative.     Allergies  Cephalosporins  Home Medications   Prior to Admission medications   Medication Sig Start Date End Date Taking? Authorizing Provider  acamprosate (CAMPRAL) 333 MG tablet Take 2 tablets (666 mg total) by mouth 3 (three) times daily with meals. Patient not taking: Reported on 11/16/2014 05/01/14   Benjamine Mola, FNP  albuterol (PROVENTIL HFA;VENTOLIN HFA) 108 (90 BASE) MCG/ACT inhaler Inhale 1-2 puffs into the lungs every 4 (four) hours as needed for wheezing or shortness of breath. 11/16/14   Nancy Flemings, MD  HYDROcodone-acetaminophen  (NORCO/VICODIN) 5-325 MG per tablet Take 1 tablet by mouth every 6 (six) hours as needed for moderate pain or severe pain. Patient not taking: Reported on 11/16/2014 06/01/14   Antonietta Breach, PA-C  hydrOXYzine (ATARAX/VISTARIL) 25 MG tablet Take 1 tablet (25 mg total) by mouth every 6 (six) hours as needed for anxiety. Patient not taking: Reported on 11/16/2014 05/01/14   Benjamine Mola, FNP  prazosin (MINIPRESS) 1 MG capsule Take 1 capsule (1 mg total) by mouth at bedtime. Patient not taking: Reported on 11/16/2014 05/01/14   Benjamine Mola, FNP  predniSONE (DELTASONE) 20 MG tablet Take 3 tablets (60 mg total) by mouth daily. 11/16/14   Nancy Flemings, MD  QUEtiapine (SEROQUEL) 400 MG tablet Take 1 tablet (400 mg total) by mouth at bedtime. Patient not taking: Reported on 11/16/2014 05/01/14   Benjamine Mola, FNP  sertraline (ZOLOFT) 50 MG tablet Take 3 tablets (150 mg total) by mouth daily. Patient not taking: Reported on 11/16/2014 05/01/14   Benjamine Mola, FNP  traZODone (DESYREL) 100 MG tablet Take 1 tablet (100 mg total) by mouth at bedtime and may repeat dose one time if needed. Patient not taking: Reported on 11/16/2014 05/01/14   Benjamine Mola, FNP   There were no vitals taken for this visit. Physical Exam  Constitutional: She is oriented to person, place, and time. She appears  well-developed and well-nourished. She appears distressed.  HENT:  Head: Normocephalic and atraumatic.  Nose: Nose normal.  Mouth/Throat: Oropharynx is clear and moist.  Eyes: Conjunctivae and EOM are normal. Pupils are equal, round, and reactive to light.  Neck: Normal range of motion. Neck supple. No JVD present. No tracheal deviation present. No thyromegaly present.  Cardiovascular: Normal rate, regular rhythm, normal heart sounds and intact distal pulses.  Exam reveals no gallop and no friction rub.   No murmur heard. Pulmonary/Chest: No stridor. She is in respiratory distress. She has wheezes. She has no rales. She  exhibits no tenderness.  Diffuse wheezing throughout  Abdominal: Soft. Bowel sounds are normal. She exhibits no distension and no mass. There is no tenderness. There is no rebound and no guarding.  Musculoskeletal: Normal range of motion. She exhibits no edema or tenderness.  Lymphadenopathy:    She has no cervical adenopathy.  Neurological: She is alert and oriented to person, place, and time. She displays normal reflexes. She exhibits normal muscle tone. Coordination normal.  Skin: Skin is warm and dry. No rash noted. No erythema. No pallor.  Psychiatric: She has a normal mood and affect. Her behavior is normal. Judgment and thought content normal.  Nursing note and vitals reviewed.   ED Course  Procedures   DIAGNOSTIC STUDIES:  Oxygen Saturation is 95% on RA, adequate by my interpretation.    COORDINATION OF CARE:  11:56 PM Pt states she feels better after receiving breathing treatment. Will discharge with steroids and inhaler. Discussed treatment plan with pt at bedside and pt agreed to plan.  Labs Review Labs Reviewed - No data to display  Imaging Review No results found.   EKG Interpretation None      MDM   Final diagnoses:  Asthma exacerbation    44 year old female with 2 days of shortness of breath, has run out of her inhaler.  After one neb treatment here, wheezing has resolved and respiratory distress has resolved.  Plan to discharge home with prednisone and albuterol inhaler.  She has been given outpatient resources.  I personally performed the services described in this documentation, which was scribed in my presence. The recorded information has been reviewed and is accurate.      Nancy Flemings, MD 11/17/14 0010

## 2014-11-17 ENCOUNTER — Encounter (HOSPITAL_COMMUNITY): Payer: Self-pay | Admitting: *Deleted

## 2014-11-17 NOTE — ED Notes (Signed)
Patient arrived via POV with SOB.   Patient unable to answers questions at this time due to breathing difficulty.

## 2014-11-17 NOTE — Discharge Instructions (Signed)
Nancy Mckinney, broncoespasmo agudo °(Asthma, Acute Bronchospasm) °El broncoespasmo agudo causado por [name] también se conoce como crisis de Nancy Mckinney. Broncoespasmo significa que las vías respiratorias se han estrechado. La causa del estrechamiento es la inflamación y la constricción de los músculos de las vías respiratorias (bronquios) que se encuentran en los pulmones. Esto puede dificultar la respiración o provocarle sibilancias y tos. °CAUSAS °Los desencadenantes posibles son: °· La caspa que eliminan los animales de la piel, el pelo o las plumas de los animales. °· Los ácaros que se encuentran en el polvo de la casa. °· [place]. °· El polen de los árboles o el césped. °· Moho. °· El humo del cigarrillo o del tabaco °· Sustancias contaminantes como el polvo, limpiadores hogareños, aerosoles (como los aerosoles para el cabello), vapores de pintura, sustancias químicas fuertes u olores intensos. °· El aire frío o cambios climáticos. El aire frío puede causar inflamación. El viento aumenta la cantidad de moho y polen del aire. °· Emociones fuertes, como llorar o reír intensamente. °· Estrés. °· Ciertos medicamentos como la aspirina o betabloqueantes. °· Los sulfitos que se encuentran en las comidas y bebidas como frutas secas y el vino. °· Enfermedades infecciosas o inflamatorias, como la gripe, el resfrío o la inflamación de las membranas nasales (rinitis). °· El reflujo gastroesofágico (ERGE). El reflujo gastroesofágico es una afección en la que los ácidos estomacales vuelven al esófago. °· Los ejercicios o actividades extenuantes. °SIGNOS Y SÍNTOMAS  °· Sibilancias. °· Tos intensa, especialmente por la noche. °· Opresión en el pecho. °· Falta de aire. °DIAGNÓSTICO  °El médico le hará una historia clínica y le hará un examen físico. Le indicarán radiografías o análisis de sangre para buscar otras causas de los síntomas u otras enfermedades que puedan desencadenar una crisis de Nancy Mckinney.  °TRATAMIENTO  °El tratamiento está  dirigido a reducir la inflamación y abrir las vías respiratorias en los pulmones. La mayor parte de las crisis asmáticas se tratan con medicamentos por vía inhalatoria. Entre ellos se incluyen los medicamentos de alivio rápido o medicamentos de rescate (como los broncodilatadores) y los medicamentos de control (como los corticoides inhalados). Estos medicamentos se administran a través de un inhalador o de un nebulizador. Los corticoides sistémicos por vía oral o por vía intravenosa también se administran para reducir la inflamación cuando un ataque es moderado o grave. Los antibióticos se indican solo si hay infección bacteriana.  °INSTRUCCIONES PARA EL CUIDADO EN EL HOGAR  °· Reposo. °· Beba líquido en abundancia. Esto ayuda a diluir la mucosidad y a eliminarla fácilmente. Solo consuma productos con cafeína moderadamente y no consuma alcohol hasta que se haya recuperado de la enfermedad. °· No fume. Evite la exposición al humo de otros fumadores. °· Usted tiene un rol fundamental en mantener su buena salud. Evite la exposición a lo que le ocasiona los problemas respiratorios. °· Mantenga los medicamentos actualizados y al alcance. Siga cuidadosamente el plan de tratamiento del médico. °· Utilice los medicamentos tal como se le indicó. °· Cuando haya mucho polen o polución, mantenga las ventanas cerradas y use el aire acondicionado o vaya a lugares con aire acondicionado. °· [name] requiere atención médica exhaustiva. Concurra a los controles según las indicaciones. Si tiene un embarazo de más de 24 semanas y le han recetado medicamentos nuevos, coméntelo con su obstetra y cuál es su evolución. Concurra a las consultas de control con su médico según las indicaciones. °· Después de recuperarse de [name], haga una cita con   el mdico para conocer cmo puede reducir la probabilidad de futuros ataques. Si no cuenta con un mdico para que controle [name], haga una cita con un mdico de atencin primaria para  hablar de esta enfermedad. Nancy Mckinney DE INMEDIATO SI:   Empeora.  Tiene dificultad para respirar. Si la dificultad es intensa comunquese con el servicio de Multimedia programmer de su localidad (911 en los [place]).  Siente dolor o Adult nurse.  Tiene vmitos.  No puede retener los lquidos.  Elimina una expectoracin verde, amarilla, amarronada o sanguinolenta.  Tiene fiebre y los sntomas empeoran repentinamente.  Presenta dificultad para tragar. ASEGRESE DE QUE:   Comprende estas instrucciones.  Controlar su afeccin.  Recibir ayuda de inmediato si no mejora o si empeora. Document Released: 10/15/2008 Document Revised: 07/04/2013 [facility] Patient Information 2015 [place]. This information is not intended to replace advice given to you by your health care provider. Make sure you discuss any questions you have with your health care provider.  Prevencin de los ataques de Nancy Mckinney (Asthma Attack Prevention) Aunque no hay modo de prevenir el inicio de un ataque de Nancy Mckinney, puede seguir estas indicaciones para controlar la enfermedad y reducir los sntomas. Aprenda sobre [name] y [place]. Tome un papel activo para controlar [name], trabajando con su mdico para crear y seguir un plan de accin. Un plan de accin para [name] puede guiarlo para:  Tomar los [employer].  Evitar aquellas cosas que provocan el ataque de Nancy Mckinney o hacen que empeore (desencadenantes del Nancy Mckinney).  Controlar su nivel de Nancy Mckinney.  Actuar si [name] empeora.  Buscar atencin mdica de emergencia cuando lo necesite. Para el control del Nancy Mckinney, lleve un registro de sus sntomas, verifique su valor de flujo pico mediante un dispositivo de mano que mide cmo el aire sale de los pulmones (medidor de flujo espiratorio mximo) y Recruitment consultant controles regulares del Nancy Mckinney.  CULES SON LAS FORMAS DE PREVENIR UN ATAQUE DE Nancy Mckinney?  Tome todos los [employer] como le indic el  mdico.  Lleve un registro de los sntomas de Nancy Mckinney y [place] de control.  Junto con su mdico, elabore un plan detallado para el uso de medicamentos y el manejo de un ataque de Nancy Mckinney. Luego asegrese de Financial risk analyst de accin. [name] es una enfermedad crnica que requiere controles y tratamiento regulares.  Identifique y evite los desencadenantes del Nancy Mckinney. Una serie de alergenos externos e irritantes (el polen, el moho, el aire fro, la contaminacin del aire) pueden desencadenar ataques de Nancy Mckinney. Averige cules son los desencadenantes del Nancy Mckinney y tome medidas para evitarlos.  Controle su respiracin. Aprenda a reconocer los signos de alerta de un ataque, como tos, sibilancias o dificultad para respirar. La funcin pulmonar puede disminuir antes de que note cualquier signo o sntoma, por lo tanto, mida y registre regularmente el flujo pico con un medidor de flujo mximo casero.  Identifique y trate los ataques antes de que se produzcan. Si acta rpidamente, es menos probable que tenga un ataque grave. Tambin necesitar menos medicamentos para controlar sus sntomas. Cuando las mediciones de flujo mximo disminuyan y Visual merchandiser sobre un prximo ataque, tome los medicamentos segn las indicaciones y Production assistant, radio de inmediato cualquier actividad que pudiera haber desencadenado el ataque. Si sus sntomas no mejoran, pida ayuda mdica.  Preste atencin si necesita aumentar el uso del inhalador de [place] rpido. Si debe depender del inhalador de alivio rpido, [name] no est bajo control.  Consulte a su mdico acerca de cmo ajustar su tratamiento. QU PUEDE EMPEORAR LOS SNTOMAS? Ciertos factores pueden hacer que los sntomas del Nancy Mckinney empeoren y causen un aumento transitorio de la inflamacin en las vas respiratorias. Lleve un registro de sus sntomas durante algunas semanas, detallando todos los factores ambientales y emocionales vinculados con [name]. Si tiene un ataque de Nancy Mckinney, vuelva a su diario para  ver qu factor o combinacin de factores podran haber contribuido. Una vez que conozca esos factores, puede tomar medidas para controlar muchos de ellos. Si sufre alergias y Nancy Mckinney, es importante tomar medidas de prevencin del Nancy Mckinney en el hogar. Si minimiza el contacto con la sustancia a la que es alrgico podr prevenir los ataques de Nancy Mckinney. Algunos desencadenantes y sus modos de evitarlos son: Caspa animal:  Algunas personas son Camera operator a las escamas de la piel o la saliva seca de los animales con pelo o plumas.   No existen razas de perros o gatos que sean incapaces de Social research officer, government. [employer] perros o gatos pueden causar [place], aunque no muden el pelaje.  Mantenga las mascotas afuera de la casa.  Si no puede mantener a Oncologist, squelas fuera de la habitacin y de las reas en las que duerme y Quarry manager la puerta cerrada.  Quite de su casa las alfombras y los muebles cubiertos con telas. Si eso no es posible, mantenga las mascotas lejos de los muebles cubiertos de tela y de las alfombras. caros del polvo: Muchas personas con Nancy Mckinney son alrgicas a los caros del polvo. Los caros del polvo son insectos diminutos que se encuentran en todos los hogares, en los colchones, [place], alfombras, muebles cubiertos de telas, [place], ropa, juguetes de peluche y otros artculos cubiertos con tela.   Cubra el colchn con una cubierta especial a prueba de polvo.  Cubra la almohada con una cubierta especial a prueba de polvo, o lave la almohada todas las semanas con agua caliente. El agua debe estar a ms de 130  F (54,5 C) para matar los caros del polvo. El agua fra o caliente con detergente y lavandina tambin puede ser eficaz.  [place] sbanas y las mantas de su cama semanalmente en agua caliente.  Trate de no dormir o acostarse sobre almohadones forrados en tela.  Si viaja, llame con anticipacin para pedir una habitacin de hotel para no fumadores. Lleve su propia ropa  de cama y almohadas, en caso de que el hotel slo suministre almohadas y edredones de plumas, que pueden contener caros del polvo y causar sntomas de Nancy Mckinney.  Quite las alfombras de su dormitorio y las adheridas al cemento, si se puede.  Mantenga los juguetes de peluche fuera de la cama o lave los juguetes semanalmente en agua caliente o agua fra con detergente y lavandina. [place]: Muchas personas que sufren Nancy Mckinney son alrgicas a las heces y restos [place].   Mantenga los alimentos y las bebidas en contenedores cerrados. Nunca deje comida a la vista.  Use venenos, trampas, polvos, geles o pastas (por ejemplo cido brico).  Si [place] un aerosol para exterminar las cucarachas, permanezca fuera de la habitacin hasta que el olor desaparezca. Moho en el interior:  Arregle las caeras que pierdan agua u otras fuentes de agua que tengan moho alrededor.  Limpie los pisos y las superficies con moho con un fungicida o lavandina diluida.  Evite el uso de humidificadores, vaporizadores o enfriantes hmedos. Pueden diseminar el moho a travs del  aire. °Polen y moho en el exterior: °· Cuando hay gran cantidad de esporas de polen o moho, trate de mantener las ventanas cerradas. °· Permanezca dentro de la habitación con las ventanas cerradas desde las últimas horas de la mañana hasta la tarde. Hay más cantidad de esporas de polen en ese momento. °· Consulte con su médico si usted necesita tomar o aumentar las dosis de antiinflamatorios antes de que comience la temporada de alergia. °Otros irritantes que debe evitar: °· El humo del tabaco es un irritante. Si fuma, pregunte a su médico cómo puede dejar de fumar. También pida a los miembros de su familia que dejen de fumar. No permita que fumen en su casa ni en el automóvil. °· En lo posible, no utilice un horno a leña, una estufa a querosene o un hogar. Minimice la exposición a toda fuente de humo, inclusive incienso, velas, fogatas o fuegos  artificiales. °· Trate de mantenerse alejado de los olores fuertes y los aerosoles como perfumes, talco, spray para el cabello y las pinturas. °· Disminuya la humedad en su casa y use un dispositivo de limpieza del aire interior. Reduzca la humedad interior a menos del 60 por ciento. Los deshumidificadores o los acondicionadores de aire central pueden hacerlo. °· Disminuya la exposición al polvo cambiando con frecuencia los filtros de hornos y acondicionadores de aire. °· Trate de que alguien pase la aspiradora una o dos veces por semana. Permanezca fuera de las habitaciones mientras son aspiradas y por algún tiempo después. °· Si usted pasa la aspiradora, use una máscara para polvo de las que se consiguen en la ferretería, una bolsa de aspiradora de doble capa o microfiltro o una aspiradora con un filtro HEPA. °· Los sulfitos que contienen los alimentos y las bebidas pueden ser irritantes. No beba cerveza ni vino, ni consuma frutas secas, patatas procesadas o langostinos, si estos le producen síntomas de Nancy Mckinney. °· El aire frío puede desencadenar un ataque de Nancy Mckinney. Cúbrase la nariz y la boca con una bufanda en los días fríos o ventosos. °· Hay varios problemas de salud que pueden hacer que [name] sea más difícil de manejar, como tener secreción nasal, sinusitis, enfermedad por reflujo, estrés psicológico y apnea del sueño. Colabore con los profesionales que lo asisten para controlar estas afecciones. °· Evite el contacto cercano con personas que tengan una infección respiratoria como resfrío o gripe, ya que los síntomas de Nancy Mckinney pueden empeorar si se contagia la infección. Lávese bien las manos después de tocar objetos que puedan haber sido manipulados por personas con una infección respiratoria. °· Vacúnese contra la gripe todos los años para protegerse contra el virus de la gripe, que con frecuencia empeora [name] durante varios días o semanas. También aplíquese la vacuna contra la neumonía si no lo ha hecho  antes. A diferencia de la vacuna contra la gripe, la vacuna contra la neumonía no debe aplicarse todos los años. °Medicamentos: °· Hable con su médico acerca de si es seguro que tome aspirina o antiinflamatorios no esteroides (AINES). En un número pequeño de personas con Nancy Mckinney, la aspirina y los AINES pueden causar ataques de Nancy Mckinney. Las personas que han padecido Nancy Mckinney por sensibilidad a estos medicamentos deben evitarlos. Es importante que las personas con Nancy Mckinney por sensibilidad a la aspirina lean las etiquetas de todos los medicamentos de venta libre que utilizan para tratar el dolor, el resfrío, la tos y la fiebre. °· Los betabloqueantes y los inhibidores ACE son otros medicamentos cuyo uso debe   consultar con su mdico. CMO PUEDO AVERIGUAR A [place]? Consulte a su mdico acerca de las pruebas de alergia en la piel o anlisis de [place] (test de RAST) para identificar los alergenos a los cuales usted es sensible. Si le diagnostican que sufre alergias, lo ms importante es tratar de [employer] exposicin a los alrgenos a los que es sensible, siempre que pueda. Se dispone de otros tratamientos para la alergia, como medicamentos y vacunas contra la alergia (inmunoterapia).  [place]? Siga las indicaciones de su mdico con respecto al tratamiento para [name] antes de hacer actividad fsica. Es importante que siga un programa regular de actividad fsica, pero el ejercicio vigoroso, o los que se realizan en lugares fros, hmedos o secos pueden causar ataques de Nancy Mckinney, especialmente en aquellas personas que sufren Nancy Mckinney inducido por el ejercicio. Document Released: 06/15/2012 Document Revised: 03/01/2013 [facility] Patient Information 2015 [place]. This information is not intended to replace advice given to you by your health care provider. Make sure you discuss any questions you have with your health care provider.

## 2014-11-17 NOTE — ED Notes (Addendum)
Patient was registered under incorrect name. Patient was given albuterol/atrovent breathing treatment and solumedrol IV while under incorrect chart.

## 2014-11-17 NOTE — ED Notes (Signed)
Patient discharged, prescriptions reviewed and instructions given on use of inhaler

## 2015-07-14 HISTORY — PX: OTHER SURGICAL HISTORY: SHX169

## 2015-08-30 ENCOUNTER — Encounter (HOSPITAL_COMMUNITY): Payer: Self-pay | Admitting: *Deleted

## 2015-08-30 ENCOUNTER — Emergency Department (HOSPITAL_COMMUNITY): Payer: No Typology Code available for payment source

## 2015-08-30 ENCOUNTER — Emergency Department (HOSPITAL_COMMUNITY)
Admission: EM | Admit: 2015-08-30 | Discharge: 2015-08-31 | Disposition: A | Discharge status: Home / Self Care | Payer: No Typology Code available for payment source | Attending: Emergency Medicine | Admitting: Emergency Medicine

## 2015-08-30 DIAGNOSIS — F319 Bipolar disorder, unspecified: Secondary | ICD-10-CM | POA: Diagnosis not present

## 2015-08-30 DIAGNOSIS — J45909 Unspecified asthma, uncomplicated: Secondary | ICD-10-CM | POA: Insufficient documentation

## 2015-08-30 DIAGNOSIS — Y9389 Activity, other specified: Secondary | ICD-10-CM | POA: Insufficient documentation

## 2015-08-30 DIAGNOSIS — S161XXA Strain of muscle, fascia and tendon at neck level, initial encounter: Secondary | ICD-10-CM | POA: Insufficient documentation

## 2015-08-30 DIAGNOSIS — Y998 Other external cause status: Secondary | ICD-10-CM | POA: Diagnosis not present

## 2015-08-30 DIAGNOSIS — S29002A Unspecified injury of muscle and tendon of back wall of thorax, initial encounter: Secondary | ICD-10-CM | POA: Diagnosis not present

## 2015-08-30 DIAGNOSIS — Z79899 Other long term (current) drug therapy: Secondary | ICD-10-CM | POA: Diagnosis not present

## 2015-08-30 DIAGNOSIS — S199XXA Unspecified injury of neck, initial encounter: Secondary | ICD-10-CM | POA: Diagnosis present

## 2015-08-30 DIAGNOSIS — Z8719 Personal history of other diseases of the digestive system: Secondary | ICD-10-CM | POA: Diagnosis not present

## 2015-08-30 DIAGNOSIS — Y92410 Unspecified street and highway as the place of occurrence of the external cause: Secondary | ICD-10-CM | POA: Diagnosis not present

## 2015-08-30 DIAGNOSIS — M546 Pain in thoracic spine: Secondary | ICD-10-CM

## 2015-08-30 MED ORDER — OXYCODONE-ACETAMINOPHEN 5-325 MG PO TABS
2.0000 | ORAL_TABLET | Freq: Once | ORAL | Status: AC
  Administered 2015-08-30: 2 via ORAL
  Filled 2015-08-30: qty 2

## 2015-08-30 MED ORDER — IBUPROFEN 200 MG PO TABS
600.0000 mg | ORAL_TABLET | Freq: Once | ORAL | Status: AC
  Administered 2015-08-30: 600 mg via ORAL
  Filled 2015-08-30: qty 3
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
## 2015-08-30 NOTE — ED Notes (Signed)
Pt updated on delay of care. Pt verbalized understanding and is comfortable in bed

## 2015-08-31 ENCOUNTER — Emergency Department (HOSPITAL_COMMUNITY): Payer: No Typology Code available for payment source
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
## 2015-08-31 NOTE — ED Notes (Signed)
Pt verbalized understanding of d/c instructions and has no further questions. Pt given instructions on how to find a pcp here in [place]. Pt stable and NAD

## 2015-09-16 ENCOUNTER — Inpatient Hospital Stay: Payer: Self-pay | Admitting: Internal Medicine

## 2016-04-22 ENCOUNTER — Encounter (HOSPITAL_COMMUNITY): Payer: Self-pay | Admitting: Neurology

## 2016-04-22 ENCOUNTER — Emergency Department (HOSPITAL_COMMUNITY)
Admission: EM | Admit: 2016-04-22 | Discharge: 2016-04-22 | Disposition: A | Discharge status: Home / Self Care | Payer: Self-pay | Attending: Emergency Medicine | Admitting: Emergency Medicine

## 2016-04-22 DIAGNOSIS — Z79899 Other long term (current) drug therapy: Secondary | ICD-10-CM | POA: Insufficient documentation

## 2016-04-22 DIAGNOSIS — N390 Urinary tract infection, site not specified: Secondary | ICD-10-CM | POA: Insufficient documentation

## 2016-04-22 DIAGNOSIS — J45909 Unspecified asthma, uncomplicated: Secondary | ICD-10-CM | POA: Insufficient documentation

## 2016-04-22 LAB — URINE MICROSCOPIC-ADD ON

## 2016-04-22 LAB — URINALYSIS, ROUTINE W REFLEX MICROSCOPIC
Bilirubin Urine: NEGATIVE
GLUCOSE, UA: NEGATIVE mg/dL
Hgb urine dipstick: NEGATIVE
KETONES UR: NEGATIVE mg/dL
NITRITE: POSITIVE — AB
PROTEIN: NEGATIVE mg/dL
Specific Gravity, Urine: 1.022 (ref 1.005–1.030)
pH: 6 (ref 5.0–8.0)

## 2016-04-22 LAB — POC URINE PREG, ED: Preg Test, Ur: NEGATIVE

## 2016-04-22 MED ORDER — SULFAMETHOXAZOLE-TRIMETHOPRIM 800-160 MG PO TABS: 1.0000 | ORAL_TABLET | Freq: Two times a day (BID) | ORAL | 0 refills | Status: AC

## 2016-04-22 NOTE — ED Notes (Signed)
C/o urinary frequency and urgency x 2 weeks.

## 2016-04-22 NOTE — ED Triage Notes (Signed)
Pt reports 2 weeks of urinary frequency, burning, urgency x 2 weeks. Reports lower abd cramping, but may be related to getting her period soon. Denies vaginal d/c. Pt is a x 4.

## 2016-04-22 NOTE — ED Provider Notes (Signed)
[facility] DEPT Provider Note   CSN: [id number] Arrival date & time: 04/22/16  1218  By signing my name below, I, Evelene Croon, attest that this documentation has been prepared under the direction and in the presence of non-physician practitioner, Glendell Docker, NP. Electronically Signed: Evelene Croon, Scribe. 04/22/2016. 1:56 PM.  History   Chief Complaint Chief Complaint  Patient presents with  . Urinary Frequency    The history is provided by the patient. No language interpreter was used.    HPI Comments:  Nancy Mckinney is a 45 y.o. female who presents to the Emergency Department complaining of urinary urgency and frequency x 2 weeks. She repots associated malodorous urine. Pt denies vaginal discharge and dysuria. She also denies recent antibiotic use and h/o UTI. No alleviating factors noted. Denies back pain or abdominal pain   Past Medical History:  Diagnosis Date  . Asthma   . Bipolar 1 disorder ([place])   . Depression   . Irritable bowel syndrome     Patient Active Problem List   Diagnosis Date Noted  . Bipolar disease, manic ([place]) 04/24/2014  . Major depressive disorder, recurrent episode, moderate ([place]) 03/21/2014  . Suicidal ideations 03/17/2014    Past Surgical History:  Procedure Laterality Date  . ABDOMINAL SURGERY      OB History    No data available       Home Medications    Prior to Admission medications   Medication Sig Start Date End Date Taking? Authorizing Provider  acamprosate (CAMPRAL) 333 MG tablet Take 2 tablets (666 mg total) by mouth 3 (three) times daily with meals. Patient not taking: Reported on 11/16/2014 05/01/14   Benjamine Mola, FNP  albuterol (PROVENTIL HFA;VENTOLIN HFA) 108 (90 BASE) MCG/ACT inhaler Inhale 1-2 puffs into the lungs every 4 (four) hours as needed for wheezing or shortness of breath. 11/16/14   Linton Flemings, MD  HYDROcodone-acetaminophen (NORCO/VICODIN) 5-325 MG per tablet Take 1 tablet by mouth every 6 (six)  hours as needed for moderate pain or severe pain. Patient not taking: Reported on 11/16/2014 06/01/14   Antonietta Breach, PA-C  hydrOXYzine (ATARAX/VISTARIL) 25 MG tablet Take 1 tablet (25 mg total) by mouth every 6 (six) hours as needed for anxiety. Patient not taking: Reported on 11/16/2014 05/01/14   Benjamine Mola, FNP  ibuprofen (ADVIL,MOTRIN) 800 MG tablet Take 800 mg by mouth every 8 (eight) hours as needed.    Historical Provider, MD  methocarbamol (ROBAXIN) 500 MG tablet Take 500 mg by mouth every 8 (eight) hours as needed for muscle spasms.    Historical Provider, MD  prazosin (MINIPRESS) 1 MG capsule Take 1 capsule (1 mg total) by mouth at bedtime. Patient not taking: Reported on 11/16/2014 05/01/14   Benjamine Mola, FNP  predniSONE (DELTASONE) 20 MG tablet Take 3 tablets (60 mg total) by mouth daily. 11/16/14   Linton Flemings, MD  QUEtiapine (SEROQUEL) 400 MG tablet Take 1 tablet (400 mg total) by mouth at bedtime. Patient not taking: Reported on 11/16/2014 05/01/14   Benjamine Mola, FNP  sertraline (ZOLOFT) 50 MG tablet Take 3 tablets (150 mg total) by mouth daily. Patient not taking: Reported on 11/16/2014 05/01/14   Benjamine Mola, FNP  traZODone (DESYREL) 100 MG tablet Take 1 tablet (100 mg total) by mouth at bedtime and may repeat dose one time if needed. Patient not taking: Reported on 11/16/2014 05/01/14   Benjamine Mola, FNP    Family History No family history on file.  Social History Social History  Substance Use Topics  . Smoking status: Never Smoker  . Smokeless tobacco: Never Used  . Alcohol use Yes     Comment: had 1 can of beer today.      Allergies   Cephalosporins   Review of Systems Review of Systems  Constitutional: Negative for fever.  Genitourinary: Positive for frequency and urgency. Negative for dysuria and vaginal discharge.  All other systems reviewed and are negative.    Physical Exam Updated Vital Signs BP (!) 160/105 (BP Location: Left Arm)   Pulse 85    Temp 98 F (36.7 C) (Oral)   Resp 16   Ht 5\' 6"  (1.676 m)   Wt 192 lb (87.1 kg)   LMP 03/27/2016   SpO2 100%   BMI 30.99 kg/m   Physical Exam  Constitutional: She is oriented to person, place, and time. She appears well-developed and well-nourished. No distress.  HENT:  Head: Normocephalic and atraumatic.  Eyes: Conjunctivae are normal.  Cardiovascular: Normal rate, regular rhythm and normal heart sounds.   Pulmonary/Chest: Effort normal.  Abdominal: Soft. She exhibits no distension. There is tenderness.  Genitourinary:  Genitourinary Comments: No cva tenderness  Musculoskeletal: Normal range of motion.  Neurological: She is alert and oriented to person, place, and time.  Skin: Skin is warm and dry.  Psychiatric: She has a normal mood and affect.  Nursing note and vitals reviewed.    ED Treatments / Results  DIAGNOSTIC STUDIES:  Oxygen Saturation is 100% on RA, normal by my interpretation.    COORDINATION OF CARE:  1:56 PM Discussed treatment plan with pt at bedside and pt agreed to plan.  Labs (all labs ordered are listed, but only abnormal results are displayed) Labs Reviewed  URINALYSIS, ROUTINE W REFLEX MICROSCOPIC (NOT AT [facility]) - Abnormal; Notable for the following:       Result Value   APPearance HAZY (*)    Nitrite POSITIVE (*)    Leukocytes, UA SMALL (*)    All other components within normal limits  URINE MICROSCOPIC-ADD ON - Abnormal; Notable for the following:    Squamous Epithelial / LPF 6-30 (*)    Bacteria, UA MANY (*)    All other components within normal limits  POC URINE PREG, ED    EKG  EKG Interpretation None       Radiology No results found.  Procedures Procedures (including critical care time)  Medications Ordered in ED Medications - No data to display   Initial Impression / Assessment and Plan / ED Course  I have reviewed the triage vital signs and the nursing notes.  Pertinent labs & imaging results that were available  during my care of the patient were reviewed by me and considered in my medical decision making (see chart for details).  Clinical Course    Will treat for uti with bactrim.  Pt diagnosed with a UTI. Pt is afebrile, tachycardia, hypotension, or other signs of serious infection.  Pt to be discharged home with antibiotics and instructions to follow up with PCP if symptoms persist. Discussed return precautions. Pt appears safe for discharge.  Final Clinical Impressions(s) / ED Diagnoses   Final diagnoses:  Urinary tract infection without hematuria, site unspecified    New Prescriptions New Prescriptions   No medications on file   I personally performed the services described in this documentation, which was scribed in my presence. The recorded information has been reviewed and is accurate.    Glendell Docker, NP  04/22/16 1401    Duffy Bruce, MD 04/22/16 1754

## 2017-02-07 ENCOUNTER — Emergency Department (HOSPITAL_COMMUNITY): Payer: Managed Care, Other (non HMO)

## 2017-02-07 ENCOUNTER — Encounter (HOSPITAL_COMMUNITY): Payer: Self-pay | Admitting: Emergency Medicine

## 2017-02-07 ENCOUNTER — Observation Stay (HOSPITAL_COMMUNITY)
Admission: EM | Admit: 2017-02-07 | Discharge: 2017-02-09 | Disposition: A | Discharge status: Home / Self Care | Payer: Managed Care, Other (non HMO) | Attending: General Surgery | Admitting: General Surgery

## 2017-02-07 DIAGNOSIS — K589 Irritable bowel syndrome without diarrhea: Secondary | ICD-10-CM | POA: Insufficient documentation

## 2017-02-07 DIAGNOSIS — K8010 Calculus of gallbladder with chronic cholecystitis without obstruction: Secondary | ICD-10-CM | POA: Diagnosis not present

## 2017-02-07 DIAGNOSIS — F319 Bipolar disorder, unspecified: Secondary | ICD-10-CM | POA: Diagnosis not present

## 2017-02-07 DIAGNOSIS — R1011 Right upper quadrant pain: Secondary | ICD-10-CM | POA: Diagnosis present

## 2017-02-07 DIAGNOSIS — R52 Pain, unspecified: Secondary | ICD-10-CM

## 2017-02-07 DIAGNOSIS — F331 Major depressive disorder, recurrent, moderate: Secondary | ICD-10-CM | POA: Insufficient documentation

## 2017-02-07 DIAGNOSIS — J45909 Unspecified asthma, uncomplicated: Secondary | ICD-10-CM | POA: Diagnosis not present

## 2017-02-07 DIAGNOSIS — K819 Cholecystitis, unspecified: Secondary | ICD-10-CM

## 2017-02-07 LAB — COMPREHENSIVE METABOLIC PANEL
ALBUMIN: 4.1 g/dL (ref 3.5–5.0)
ALT: 45 U/L (ref 14–54)
AST: 48 U/L — AB (ref 15–41)
Alkaline Phosphatase: 73 U/L (ref 38–126)
Anion gap: 7 (ref 5–15)
BUN: 17 mg/dL (ref 6–20)
CHLORIDE: 108 mmol/L (ref 101–111)
CO2: 24 mmol/L (ref 22–32)
Calcium: 9.2 mg/dL (ref 8.9–10.3)
Creatinine, Ser: 0.71 mg/dL (ref 0.44–1.00)
GFR calc Af Amer: >60 mL/min (ref >60)
GFR calc non Af Amer: >60 mL/min (ref >60)
GLUCOSE: 94 mg/dL (ref 65–99)
POTASSIUM: 3.3 mmol/L — AB (ref 3.5–5.1)
Sodium: 139 mmol/L (ref 135–145)
Total Bilirubin: 0.3 mg/dL (ref 0.3–1.2)
Total Protein: 8.2 g/dL — ABNORMAL HIGH (ref 6.5–8.1)

## 2017-02-07 LAB — CBC
HEMATOCRIT: 31.2 % — AB (ref 36.0–46.0)
Hemoglobin: 10 g/dL — ABNORMAL LOW (ref 12.0–15.0)
MCH: 25.4 pg — AB (ref 26.0–34.0)
MCHC: 32.1 g/dL (ref 30.0–36.0)
MCV: 79.4 fL (ref 78.0–100.0)
Platelets: 133 10*3/uL — ABNORMAL LOW (ref 150–400)
RBC: 3.93 MIL/uL (ref 3.87–5.11)
RDW: 12.9 % (ref 11.5–15.5)
WBC: 4.7 10*3/uL (ref 4.0–10.5)

## 2017-02-07 LAB — URINALYSIS, ROUTINE W REFLEX MICROSCOPIC
Bilirubin Urine: NEGATIVE
GLUCOSE, UA: NEGATIVE mg/dL
HGB URINE DIPSTICK: NEGATIVE
Ketones, ur: NEGATIVE mg/dL
Leukocytes, UA: NEGATIVE
Nitrite: NEGATIVE
Protein, ur: NEGATIVE mg/dL
Specific Gravity, Urine: 1.023 (ref 1.005–1.030)
pH: 5 (ref 5.0–8.0)

## 2017-02-07 LAB — LIPASE, BLOOD: Lipase: 31 U/L (ref 11–51)

## 2017-02-07 MED ORDER — METRONIDAZOLE IN NACL 5-0.79 MG/ML-% IV SOLN
500.0000 mg | Freq: Once | INTRAVENOUS | Status: AC
  Administered 2017-02-07: 500 mg via INTRAVENOUS
  Filled 2017-02-07: qty 100

## 2017-02-07 MED ORDER — ONDANSETRON 4 MG PO TBDP: 4.0000 mg | ORAL_TABLET | Freq: Four times a day (QID) | ORAL | Status: DC | PRN

## 2017-02-07 MED ORDER — HYDROMORPHONE HCL 1 MG/ML IJ SOLN
1.0000 mg | Freq: Once | INTRAMUSCULAR | Status: AC
  Administered 2017-02-07: 1 mg via INTRAVENOUS
  Filled 2017-02-07: qty 1

## 2017-02-07 MED ORDER — ALBUTEROL SULFATE (2.5 MG/3ML) 0.083% IN NEBU: 2.5000 mg | INHALATION_SOLUTION | RESPIRATORY_TRACT | Status: DC | PRN

## 2017-02-07 MED ORDER — CIPROFLOXACIN IN D5W 400 MG/200ML IV SOLN
400.0000 mg | Freq: Once | INTRAVENOUS | Status: AC
Start: 2017-02-07 — End: 2017-02-07
  Administered 2017-02-07: 400 mg via INTRAVENOUS
  Filled 2017-02-07: qty 200

## 2017-02-07 MED ORDER — ONDANSETRON HCL 4 MG/2ML IJ SOLN
4.0000 mg | Freq: Once | INTRAMUSCULAR | Status: AC
  Administered 2017-02-07: 4 mg via INTRAVENOUS
  Filled 2017-02-07: qty 2

## 2017-02-07 MED ORDER — MORPHINE SULFATE (PF) 2 MG/ML IV SOLN
4.0000 mg | Freq: Once | INTRAVENOUS | Status: AC
  Administered 2017-02-07: 4 mg via INTRAVENOUS
  Filled 2017-02-07: qty 2

## 2017-02-07 MED ORDER — METRONIDAZOLE IN NACL 5-0.79 MG/ML-% IV SOLN
500.0000 mg | Freq: Three times a day (TID) | INTRAVENOUS | Status: DC
  Administered 2017-02-07 – 2017-02-08 (×2): 500 mg via INTRAVENOUS
  Filled 2017-02-07 (×2): qty 100

## 2017-02-07 MED ORDER — KCL IN DEXTROSE-NACL 20-5-0.9 MEQ/L-%-% IV SOLN
INTRAVENOUS | Status: DC
  Administered 2017-02-07: 19:00:00 via INTRAVENOUS
  Filled 2017-02-07 (×2): qty 1000

## 2017-02-07 MED ORDER — MORPHINE SULFATE (PF) 2 MG/ML IV SOLN
1.0000 mg | INTRAVENOUS | Status: DC | PRN
  Administered 2017-02-07 – 2017-02-08 (×4): 2 mg via INTRAVENOUS
  Filled 2017-02-07 (×4): qty 1

## 2017-02-07 MED ORDER — CIPROFLOXACIN IN D5W 400 MG/200ML IV SOLN
400.0000 mg | Freq: Two times a day (BID) | INTRAVENOUS | Status: DC
  Administered 2017-02-08: 400 mg via INTRAVENOUS
  Filled 2017-02-07: qty 200

## 2017-02-07 MED ORDER — PANTOPRAZOLE SODIUM 40 MG IV SOLR
40.0000 mg | Freq: Every day | INTRAVENOUS | Status: DC
  Administered 2017-02-07: 40 mg via INTRAVENOUS
  Filled 2017-02-07: qty 40

## 2017-02-07 MED ORDER — ONDANSETRON HCL 4 MG/2ML IJ SOLN
4.0000 mg | Freq: Four times a day (QID) | INTRAMUSCULAR | Status: DC | PRN
Start: 2017-02-07 — End: 2017-02-08
  Administered 2017-02-07 – 2017-02-08 (×2): 4 mg via INTRAVENOUS
  Filled 2017-02-07 (×2): qty 2

## 2017-02-07 NOTE — ED Notes (Signed)
US at bedside

## 2017-02-07 NOTE — ED Triage Notes (Signed)
Pt c/o RUQ pain and nausea, worsening today and c/o headache. Pt states abdominal pain is sharp and radiates to the mid back. Pt denies vomiting, denies diarrhea. Attempted Gas-X without relief.

## 2017-02-07 NOTE — ED Provider Notes (Signed)
[facility] DEPT Provider Note   CSN: [id number] Arrival date & time: 02/07/17  1131     History   Chief Complaint Chief Complaint  Patient presents with  . Abdominal Pain    RUQ  . Nausea  . Headache    HPI Nancy Mckinney is a 46 y.o. female.  The history is provided by the patient.  Abdominal Pain   This is a new problem. Episode onset: 1 week. The problem occurs constantly. The problem has been gradually worsening. The pain is associated with an unknown factor. The pain is located in the RUQ. The quality of the pain is aching and shooting. The pain is moderate. Associated symptoms include nausea, vomiting (NBNB) and headaches. Pertinent negatives include fever, diarrhea, hematochezia, melena and dysuria. Nothing aggravates the symptoms. Nothing relieves the symptoms.   Currently on Phentermine for 3 months for wt loss.   Past Medical History:  Diagnosis Date  . Asthma   . Bipolar 1 disorder ([place])   . Depression   . Irritable bowel syndrome     Patient Active Problem List   Diagnosis Date Noted  . Bipolar disease, manic ([place]) 04/24/2014  . Major depressive disorder, recurrent episode, moderate ([place]) 03/21/2014  . Suicidal ideations 03/17/2014    Past Surgical History:  Procedure Laterality Date  . ABDOMINAL SURGERY      OB History    No data available       Home Medications    Prior to Admission medications   Medication Sig Start Date End Date Taking? Authorizing Provider  albuterol (PROVENTIL HFA;VENTOLIN HFA) 108 (90 BASE) MCG/ACT inhaler Inhale 1-2 puffs into the lungs every 4 (four) hours as needed for wheezing or shortness of breath. 11/16/14  Yes Linton Flemings, MD  Multiple Vitamin (MULTIVITAMIN) tablet Take 1 tablet by mouth daily.   Yes [provider]  naproxen sodium (ANAPROX) 220 MG tablet Take 220 mg by mouth 2 (two) times daily with a meal.   Yes [provider]  phentermine 37.5 MG capsule Take 37.5 mg by mouth every  morning.   Yes [provider]  ibuprofen (ADVIL,MOTRIN) 800 MG tablet Take 800 mg by mouth every 8 (eight) hours as needed.    [provider]  methocarbamol (ROBAXIN) 500 MG tablet Take 500 mg by mouth every 8 (eight) hours as needed for muscle spasms.    [provider]    Family History History reviewed. No pertinent family history.  Social History Social History  Substance Use Topics  . Smoking status: Never Smoker  . Smokeless tobacco: Never Used  . Alcohol use Yes     Comment: had 1 can of beer today.      Allergies   Cephalosporins   Review of Systems Review of Systems  Constitutional: Negative for fever.  Gastrointestinal: Positive for abdominal pain, nausea and vomiting (NBNB). Negative for diarrhea, hematochezia and melena.  Genitourinary: Negative for dysuria.  Neurological: Positive for headaches.  All other systems are reviewed and are negative for acute change except as noted in the HPI    Physical Exam Updated Vital Signs BP (!) 145/95   Pulse 95   Temp 98.5 F (36.9 C) (Oral)   Resp 18   LMP 02/04/2017 (Exact Date)   SpO2 100%   Physical Exam  Constitutional: She is oriented to person, place, and time. She appears well-developed and well-nourished. No distress.  HENT:  Head: Normocephalic and atraumatic.  Nose: Nose normal.  Eyes: Pupils are equal, round,  and reactive to light. Conjunctivae and EOM are normal. Right eye exhibits no discharge. Left eye exhibits no discharge. No scleral icterus.  Neck: Normal range of motion. Neck supple.  Cardiovascular: Normal rate and regular rhythm.  Exam reveals no gallop and no friction rub.   No murmur heard. Pulmonary/Chest: Effort normal and breath sounds normal. No stridor. No respiratory distress. She has no rales.  Abdominal: Soft. She exhibits no distension. There is tenderness in the right upper quadrant and right lower quadrant. There is CVA tenderness (right). There is no  rigidity, no rebound and no guarding. No hernia.  Musculoskeletal: She exhibits no edema or tenderness.  Neurological: She is alert and oriented to person, place, and time.  Skin: Skin is warm and dry. No rash noted. She is not diaphoretic. No erythema.  Psychiatric: She has a normal mood and affect.  Vitals reviewed.    ED Treatments / Results  Labs (all labs ordered are listed, but only abnormal results are displayed) Labs Reviewed  COMPREHENSIVE METABOLIC PANEL - Abnormal; Notable for the following:       Result Value   Potassium 3.3 (*)    Total Protein 8.2 (*)    AST 48 (*)    All other components within normal limits  CBC - Abnormal; Notable for the following:    Hemoglobin 10.0 (*)    HCT 31.2 (*)    MCH 25.4 (*)    Platelets 133 (*)    All other components within normal limits  LIPASE, BLOOD  URINALYSIS, ROUTINE W REFLEX MICROSCOPIC    EKG  EKG Interpretation None       Radiology US Abdomen Limited Ruq  Result Date: 02/07/2017 CLINICAL DATA:  Right upper quadrant pain.  Nausea and vomiting. EXAM: ULTRASOUND ABDOMEN LIMITED RIGHT UPPER QUADRANT COMPARISON:  None. FINDINGS: Gallbladder: The gallbladder wall is mildly thickened measuring 4 mm. Pericholecystic fluid noted. Multiple stones identified. Positive sonographic Percell Bufkin sign reported by the sonographer. Common bile duct: Diameter: 9 mm Liver: No focal lesion identified. Within normal limits in parenchymal echogenicity. IMPRESSION: 1. Imaging findings suggestive of cholecystitis including: Gallstones, positive sonographic Murphy's sign, pericholecystic fluid and gallbladder wall thickening. Electronically Signed   By: Kerby Moors M.D.   On: 02/07/2017 13:50    Procedures Procedures (including critical care time)  EMERGENCY DEPARTMENT BILIARY ULTRASOUND INTERPRETATION "Study: Limited Abdominal Ultrasound of the Gallbladder and Common Bile Duct."  INDICATIONS: Abdominal pain Indication: Multiple views of  the gallbladder and common bile duct were obtained in real-time with a Multi-frequency probe."  PERFORMED BY:  Myself IMAGES ARCHIVED?: Yes LIMITATIONS: Abdominal pain INTERPRETATION: Cholelithiasis, Cholecystitis, Gallbladder wall thickened >95mm, Sonographic Murphy's sign present and Common bile duct enlarged   Medications Ordered in ED Medications  ciprofloxacin (CIPRO) IVPB 400 mg (400 mg Intravenous New Bag/Given 02/07/17 1436)  metroNIDAZOLE (FLAGYL) IVPB 500 mg (not administered)  morphine 2 MG/ML injection 4 mg (4 mg Intravenous Given 02/07/17 1303)  ondansetron (ZOFRAN) injection 4 mg (4 mg Intravenous Given 02/07/17 1303)  HYDROmorphone (DILAUDID) injection 1 mg (1 mg Intravenous Given 02/07/17 1351)     Initial Impression / Assessment and Plan / ED Course  I have reviewed the triage vital signs and the nursing notes.  Pertinent labs & imaging results that were available during my care of the patient were reviewed by me and considered in my medical decision making (see chart for details).     Confirmed acute cholecystitis without evidence of biliary obstruction. Patient provided with IV hydration,  pain medicine, Antibiotics. Dr. Marlou Starks from general surgery was contacted who will admit the patient for further management.  Final Clinical Impressions(s) / ED Diagnoses   Final diagnoses:  RUQ pain  Cholecystitis      Diogo Anne, Grayce Sessions, MD 02/07/17 [phone]

## 2017-02-07 NOTE — H&P (Signed)
Nancy Mckinney is an 46 y.o. female.   Chief Complaint: abdominal pain HPI:  The patient is a 46 year old black female who presents to the emergency department with severe right upper quadrant pain that started last night. In retrospect she feels as though she has been having some upper abdominal discomfort for the last 2-3 weeks. The pain is been associated with nausea and dry heaves. She denies any fevers or chills. She came to the emergency department where an ultrasound showed some gallbladder wall thickening with stones. Her liver functions were normal.  Past Medical History:  Diagnosis Date  . Asthma   . Bipolar 1 disorder ([place])   . Depression   . Irritable bowel syndrome     Past Surgical History:  Procedure Laterality Date  . ABDOMINAL SURGERY      History reviewed. No pertinent family history. Social History:  reports that she has never smoked. She has never used smokeless tobacco. She reports that she drinks alcohol. She reports that she does not use drugs.  Allergies:  Allergies  Allergen Reactions  . Cephalosporins Diarrhea     (Not in a hospital admission)  Results for orders placed or performed during the hospital encounter of 02/07/17 (from the past 48 hour(s))  Lipase, blood     Status: None   Collection Time: 02/07/17 11:44 AM  Result Value Ref Range   Lipase 31 11 - 51 U/L  Comprehensive metabolic panel     Status: Abnormal   Collection Time: 02/07/17 11:44 AM  Result Value Ref Range   Sodium 139 135 - 145 mmol/L   Potassium 3.3 (L) 3.5 - 5.1 mmol/L   Chloride 108 101 - 111 mmol/L   CO2 24 22 - 32 mmol/L   Glucose, Bld 94 65 - 99 mg/dL   BUN 17 6 - 20 mg/dL   Creatinine, Ser 0.71 0.44 - 1.00 mg/dL   Calcium 9.2 8.9 - 10.3 mg/dL   Total Protein 8.2 (H) 6.5 - 8.1 g/dL   Albumin 4.1 3.5 - 5.0 g/dL   AST 48 (H) 15 - 41 U/L   ALT 45 14 - 54 U/L   Alkaline Phosphatase 73 38 - 126 U/L   Total Bilirubin 0.3 0.3 - 1.2 mg/dL   GFR calc non Af Amer >60 >60  mL/min   GFR calc Af Amer >60 >60 mL/min    Comment: (NOTE) The eGFR has been calculated using the CKD EPI equation. This calculation has not been validated in all clinical situations. eGFR's persistently <60 mL/min signify possible Chronic Kidney Disease.    Anion gap 7 5 - 15  CBC     Status: Abnormal   Collection Time: 02/07/17 11:44 AM  Result Value Ref Range   WBC 4.7 4.0 - 10.5 K/uL   RBC 3.93 3.87 - 5.11 MIL/uL   Hemoglobin 10.0 (L) 12.0 - 15.0 g/dL   HCT 31.2 (L) 36.0 - 46.0 %   MCV 79.4 78.0 - 100.0 fL   MCH 25.4 (L) 26.0 - 34.0 pg   MCHC 32.1 30.0 - 36.0 g/dL   RDW 12.9 11.5 - 15.5 %   Platelets 133 (L) 150 - 400 K/uL  Urinalysis, Routine w reflex microscopic     Status: None   Collection Time: 02/07/17 11:44 AM  Result Value Ref Range   Color, Urine YELLOW YELLOW   APPearance CLEAR CLEAR   Specific Gravity, Urine 1.023 1.005 - 1.030   pH 5.0 5.0 - 8.0   Glucose, UA NEGATIVE  NEGATIVE mg/dL   Hgb urine dipstick NEGATIVE NEGATIVE   Bilirubin Urine NEGATIVE NEGATIVE   Ketones, ur NEGATIVE NEGATIVE mg/dL   Protein, ur NEGATIVE NEGATIVE mg/dL   Nitrite NEGATIVE NEGATIVE   Leukocytes, UA NEGATIVE NEGATIVE   US Abdomen Limited Ruq  Result Date: 02/07/2017 CLINICAL DATA:  Right upper quadrant pain.  Nausea and vomiting. EXAM: ULTRASOUND ABDOMEN LIMITED RIGHT UPPER QUADRANT COMPARISON:  None. FINDINGS: Gallbladder: The gallbladder wall is mildly thickened measuring 4 mm. Pericholecystic fluid noted. Multiple stones identified. Positive sonographic Percell Swamy sign reported by the sonographer. Common bile duct: Diameter: 9 mm Liver: No focal lesion identified. Within normal limits in parenchymal echogenicity. IMPRESSION: 1. Imaging findings suggestive of cholecystitis including: Gallstones, positive sonographic Murphy's sign, pericholecystic fluid and gallbladder wall thickening. Electronically Signed   By: Kerby Moors M.D.   On: 02/07/2017 13:50    Review of Systems   Constitutional: Negative.   HENT: Negative.   Eyes: Negative.   Respiratory: Negative.   Cardiovascular: Negative.   Gastrointestinal: Positive for abdominal pain, nausea and vomiting.  Genitourinary: Negative.   Musculoskeletal: Negative.   Skin: Negative.   Neurological: Negative.   Endo/Heme/Allergies: Negative.   Psychiatric/Behavioral: Negative.     Blood pressure (!) 136/97, pulse 72, temperature 98.5 F (36.9 C), temperature source Oral, resp. rate 16, last menstrual period 02/04/2017, SpO2 100 %. Physical Exam  Constitutional: She is oriented to person, place, and time. She appears well-developed and well-nourished. No distress.  HENT:  Head: Normocephalic and atraumatic.  Mouth/Throat: No oropharyngeal exudate.  Eyes: Pupils are equal, round, and reactive to light. Conjunctivae and EOM are normal.  Neck: Normal range of motion. Neck supple.  Cardiovascular: Normal rate, regular rhythm and normal heart sounds.   Respiratory: Effort normal and breath sounds normal. No stridor. No respiratory distress.  GI: Soft. Bowel sounds are normal. There is tenderness.  Musculoskeletal: Normal range of motion. She exhibits no edema or tenderness.  Neurological: She is alert and oriented to person, place, and time. Coordination normal.  Skin: Skin is warm and dry. No rash noted.  Psychiatric: She has a normal mood and affect. Her behavior is normal. Thought content normal.     Assessment/Plan  The patient appears to have cholecystitis with cholelithiasis that is symptomatic. Because of the risk of further painful episodes and possible pancreatitis that she would benefit from having her gallbladder removed. She would also like to have this done. I have discussed with her in detail the risks and benefits of the operation to remove the gallbladder as well as some of the technical aspects and she understands and wishes to proceed. We will plan to admit her tonight for IV hydration and  broad-spectrum antibiotic therapy. We will plan for surgery in the morning with the primary team  Merrie Roof, MD 02/07/2017, 3:10 PM

## 2017-02-07 NOTE — ED Notes (Signed)
Report given-transfer to 3rd floor

## 2017-02-08 ENCOUNTER — Observation Stay (HOSPITAL_COMMUNITY): Payer: Managed Care, Other (non HMO)

## 2017-02-08 ENCOUNTER — Observation Stay (HOSPITAL_COMMUNITY): Payer: Managed Care, Other (non HMO) | Admitting: Certified Registered Nurse Anesthetist

## 2017-02-08 ENCOUNTER — Encounter (HOSPITAL_COMMUNITY): Payer: Self-pay | Admitting: Surgery

## 2017-02-08 ENCOUNTER — Encounter (HOSPITAL_COMMUNITY)
Admission: EM | Disposition: A | Discharge status: Home / Self Care | Payer: Self-pay | Source: Home / Self Care | Attending: Emergency Medicine

## 2017-02-08 HISTORY — PX: CHOLECYSTECTOMY: SHX55

## 2017-02-08 LAB — SURGICAL PCR SCREEN
MRSA, PCR: NEGATIVE
STAPHYLOCOCCUS AUREUS: NEGATIVE

## 2017-02-08 LAB — HIV ANTIBODY (ROUTINE TESTING W REFLEX): HIV SCREEN 4TH GENERATION: NONREACTIVE

## 2017-02-08 SURGERY — LAPAROSCOPIC CHOLECYSTECTOMY WITH INTRAOPERATIVE CHOLANGIOGRAM
Anesthesia: General | Site: Abdomen

## 2017-02-08 MED ORDER — FENTANYL CITRATE (PF) 100 MCG/2ML IJ SOLN
INTRAMUSCULAR | Status: DC | PRN
  Administered 2017-02-08 (×3): 50 ug via INTRAVENOUS

## 2017-02-08 MED ORDER — FENTANYL CITRATE (PF) 100 MCG/2ML IJ SOLN
INTRAMUSCULAR | Status: AC
  Filled 2017-02-08: qty 2

## 2017-02-08 MED ORDER — LIDOCAINE 2% (20 MG/ML) 5 ML SYRINGE
INTRAMUSCULAR | Status: AC
  Filled 2017-02-08: qty 5

## 2017-02-08 MED ORDER — ONDANSETRON HCL 4 MG/2ML IJ SOLN
INTRAMUSCULAR | Status: DC | PRN
  Administered 2017-02-08: 4 mg via INTRAVENOUS

## 2017-02-08 MED ORDER — ONDANSETRON HCL 4 MG/2ML IJ SOLN: 4.0000 mg | Freq: Four times a day (QID) | INTRAMUSCULAR | Status: DC | PRN

## 2017-02-08 MED ORDER — METOCLOPRAMIDE HCL 5 MG/ML IJ SOLN: 10.0000 mg | Freq: Once | INTRAMUSCULAR | Status: DC | PRN

## 2017-02-08 MED ORDER — SUCCINYLCHOLINE CHLORIDE 200 MG/10ML IV SOSY
PREFILLED_SYRINGE | INTRAVENOUS | Status: AC
  Filled 2017-02-08: qty 10

## 2017-02-08 MED ORDER — 0.9 % SODIUM CHLORIDE (POUR BTL) OPTIME
TOPICAL | Status: DC | PRN
  Administered 2017-02-08: 1000 mL

## 2017-02-08 MED ORDER — KCL IN DEXTROSE-NACL 20-5-0.45 MEQ/L-%-% IV SOLN
INTRAVENOUS | Status: DC
  Administered 2017-02-08: 11:00:00 via INTRAVENOUS
  Administered 2017-02-09: 1000 mL via INTRAVENOUS
  Filled 2017-02-08 (×2): qty 1000

## 2017-02-08 MED ORDER — GLYCOPYRROLATE 0.2 MG/ML IV SOSY
PREFILLED_SYRINGE | INTRAVENOUS | Status: DC | PRN
  Administered 2017-02-08: 0.4 mg via INTRAVENOUS

## 2017-02-08 MED ORDER — MIDAZOLAM HCL 5 MG/5ML IJ SOLN
INTRAMUSCULAR | Status: DC | PRN
  Administered 2017-02-08: 1 mg via INTRAVENOUS

## 2017-02-08 MED ORDER — IOPAMIDOL (ISOVUE-300) INJECTION 61%
INTRAVENOUS | Status: AC
  Filled 2017-02-08: qty 50

## 2017-02-08 MED ORDER — LIDOCAINE 2% (20 MG/ML) 5 ML SYRINGE
INTRAMUSCULAR | Status: DC | PRN
  Administered 2017-02-08: 80 mg via INTRAVENOUS

## 2017-02-08 MED ORDER — FENTANYL CITRATE (PF) 100 MCG/2ML IJ SOLN
25.0000 ug | INTRAMUSCULAR | Status: DC | PRN
  Administered 2017-02-08: 50 ug via INTRAVENOUS

## 2017-02-08 MED ORDER — GLYCOPYRROLATE 0.2 MG/ML IV SOSY
PREFILLED_SYRINGE | INTRAVENOUS | Status: AC
  Filled 2017-02-08: qty 5

## 2017-02-08 MED ORDER — ACETAMINOPHEN 650 MG RE SUPP: 650.0000 mg | Freq: Four times a day (QID) | RECTAL | Status: DC | PRN

## 2017-02-08 MED ORDER — ONDANSETRON 4 MG PO TBDP: 4.0000 mg | ORAL_TABLET | Freq: Four times a day (QID) | ORAL | Status: DC | PRN

## 2017-02-08 MED ORDER — DEXAMETHASONE SODIUM PHOSPHATE 10 MG/ML IJ SOLN
INTRAMUSCULAR | Status: DC | PRN
  Administered 2017-02-08: 10 mg via INTRAVENOUS

## 2017-02-08 MED ORDER — SCOPOLAMINE 1 MG/3DAYS TD PT72
MEDICATED_PATCH | TRANSDERMAL | Status: AC
  Filled 2017-02-08: qty 1

## 2017-02-08 MED ORDER — LACTATED RINGERS IV SOLN
INTRAVENOUS | Status: DC | PRN
  Administered 2017-02-08 (×2): via INTRAVENOUS

## 2017-02-08 MED ORDER — SUCCINYLCHOLINE CHLORIDE 200 MG/10ML IV SOSY
PREFILLED_SYRINGE | INTRAVENOUS | Status: DC | PRN
  Administered 2017-02-08: 120 mg via INTRAVENOUS

## 2017-02-08 MED ORDER — DEXAMETHASONE SODIUM PHOSPHATE 10 MG/ML IJ SOLN
INTRAMUSCULAR | Status: AC
  Filled 2017-02-08: qty 1

## 2017-02-08 MED ORDER — MEPERIDINE HCL 50 MG/ML IJ SOLN: 6.2500 mg | INTRAMUSCULAR | Status: DC | PRN

## 2017-02-08 MED ORDER — SUGAMMADEX SODIUM 200 MG/2ML IV SOLN
INTRAVENOUS | Status: DC | PRN
Start: 2017-02-08 — End: 2017-02-08
  Administered 2017-02-08: 200 mg via INTRAVENOUS

## 2017-02-08 MED ORDER — EPHEDRINE 5 MG/ML INJ
INTRAVENOUS | Status: AC
  Filled 2017-02-08: qty 10

## 2017-02-08 MED ORDER — ROCURONIUM BROMIDE 50 MG/5ML IV SOSY
PREFILLED_SYRINGE | INTRAVENOUS | Status: DC | PRN
  Administered 2017-02-08: 40 mg via INTRAVENOUS

## 2017-02-08 MED ORDER — ROCURONIUM BROMIDE 50 MG/5ML IV SOSY
PREFILLED_SYRINGE | INTRAVENOUS | Status: AC
  Filled 2017-02-08: qty 5

## 2017-02-08 MED ORDER — LACTATED RINGERS IV SOLN: INTRAVENOUS | Status: DC

## 2017-02-08 MED ORDER — PROPOFOL 10 MG/ML IV BOLUS
INTRAVENOUS | Status: AC
  Filled 2017-02-08: qty 20

## 2017-02-08 MED ORDER — HYDROMORPHONE HCL-NACL 0.5-0.9 MG/ML-% IV SOSY
1.0000 mg | PREFILLED_SYRINGE | INTRAVENOUS | Status: DC | PRN
  Administered 2017-02-08 (×2): 1 mg via INTRAVENOUS
  Filled 2017-02-08 (×2): qty 2

## 2017-02-08 MED ORDER — ACETAMINOPHEN 325 MG PO TABS: 650.0000 mg | ORAL_TABLET | Freq: Four times a day (QID) | ORAL | Status: DC | PRN

## 2017-02-08 MED ORDER — BUPIVACAINE-EPINEPHRINE 0.5% -1:200000 IJ SOLN
INTRAMUSCULAR | Status: DC | PRN
  Administered 2017-02-08: 20 mL

## 2017-02-08 MED ORDER — HYDROCODONE-ACETAMINOPHEN 5-325 MG PO TABS
1.0000 | ORAL_TABLET | ORAL | Status: DC | PRN
  Administered 2017-02-08 – 2017-02-09 (×3): 2 via ORAL
  Filled 2017-02-08 (×3): qty 2

## 2017-02-08 MED ORDER — LACTATED RINGERS IR SOLN
Status: DC | PRN
Start: 2017-02-08 — End: 2017-02-08
  Administered 2017-02-08: 1000 mL

## 2017-02-08 MED ORDER — ONDANSETRON HCL 4 MG/2ML IJ SOLN
INTRAMUSCULAR | Status: AC
  Filled 2017-02-08: qty 2

## 2017-02-08 MED ORDER — PROPOFOL 10 MG/ML IV BOLUS
INTRAVENOUS | Status: DC | PRN
  Administered 2017-02-08: 150 mg via INTRAVENOUS

## 2017-02-08 MED ORDER — EPHEDRINE SULFATE-NACL 50-0.9 MG/10ML-% IV SOSY
PREFILLED_SYRINGE | INTRAVENOUS | Status: DC | PRN
  Administered 2017-02-08: 5 mg via INTRAVENOUS

## 2017-02-08 MED ORDER — PROMETHAZINE HCL 25 MG/ML IJ SOLN
12.5000 mg | Freq: Four times a day (QID) | INTRAMUSCULAR | Status: DC | PRN
  Administered 2017-02-08: 12.5 mg via INTRAVENOUS
  Filled 2017-02-08: qty 1

## 2017-02-08 MED ORDER — FENTANYL CITRATE (PF) 250 MCG/5ML IJ SOLN
INTRAMUSCULAR | Status: AC
  Filled 2017-02-08: qty 5

## 2017-02-08 MED ORDER — IOPAMIDOL (ISOVUE-300) INJECTION 61%
INTRAVENOUS | Status: DC | PRN
  Administered 2017-02-08: 14 mL via INTRAVENOUS

## 2017-02-08 MED ORDER — SUGAMMADEX SODIUM 200 MG/2ML IV SOLN
INTRAVENOUS | Status: AC
  Filled 2017-02-08: qty 2

## 2017-02-08 MED ORDER — SCOPOLAMINE 1 MG/3DAYS TD PT72
MEDICATED_PATCH | TRANSDERMAL | Status: DC | PRN
  Administered 2017-02-08: 1 via TRANSDERMAL

## 2017-02-08 MED ORDER — MIDAZOLAM HCL 2 MG/2ML IJ SOLN
INTRAMUSCULAR | Status: AC
  Filled 2017-02-08: qty 2

## 2017-02-08 MED ORDER — BUPIVACAINE-EPINEPHRINE 0.5% -1:200000 IJ SOLN
INTRAMUSCULAR | Status: AC
  Filled 2017-02-08: qty 1

## 2017-02-08 SURGICAL SUPPLY — 37 items
APPLIER CLIP ROT 10 11.4 M/L (STAPLE) ×3
APR CLP MED LRG 11.4X10 (STAPLE) ×1
BAG SPEC RTRVL LRG 6X4 10 (ENDOMECHANICALS) ×1
CABLE HIGH FREQUENCY MONO STRZ (ELECTRODE) ×3 IMPLANT
CHLORAPREP W/TINT 26ML (MISCELLANEOUS) ×6 IMPLANT
CLIP APPLIE ROT 10 11.4 M/L (STAPLE) ×1 IMPLANT
CLOSURE WOUND 1/2 X4 (GAUZE/BANDAGES/DRESSINGS) ×2
COVER MAYO STAND STRL (DRAPES) ×3 IMPLANT
COVER SURGICAL LIGHT HANDLE (MISCELLANEOUS) ×3 IMPLANT
DECANTER SPIKE VIAL GLASS SM (MISCELLANEOUS) ×3 IMPLANT
DRAPE C-ARM 42X120 X-RAY (DRAPES) ×3 IMPLANT
ELECT L-HOOK LAP 45CM DISP (ELECTROSURGICAL) ×3
ELECT PENCIL ROCKER SW 15FT (MISCELLANEOUS) ×2 IMPLANT
ELECT REM PT RETURN 15FT ADLT (MISCELLANEOUS) ×3 IMPLANT
ELECTRODE L-HOOK LAP 45CM DISP (ELECTROSURGICAL) IMPLANT
GAUZE SPONGE 2X2 8PLY STRL LF (GAUZE/BANDAGES/DRESSINGS) ×1 IMPLANT
GLOVE SURG ORTHO 8.0 STRL STRW (GLOVE) ×3 IMPLANT
GOWN STRL REUS W/TWL XL LVL3 (GOWN DISPOSABLE) ×6 IMPLANT
HEMOSTAT SURGICEL 4X8 (HEMOSTASIS) IMPLANT
IRRIG SUCT STRYKERFLOW 2 WTIP (MISCELLANEOUS) ×3
IRRIGATION SUCT STRKRFLW 2 WTP (MISCELLANEOUS) ×1 IMPLANT
KIT BASIN OR (CUSTOM PROCEDURE TRAY) ×3 IMPLANT
POUCH SPECIMEN RETRIEVAL 10MM (ENDOMECHANICALS) ×3 IMPLANT
SCISSORS LAP 5X35 DISP (ENDOMECHANICALS) ×3 IMPLANT
SET CHOLANGIOGRAPH MIX (MISCELLANEOUS) ×3 IMPLANT
SLEEVE XCEL OPT CAN 5 100 (ENDOMECHANICALS) ×3 IMPLANT
SPONGE GAUZE 2X2 STER 10/PKG (GAUZE/BANDAGES/DRESSINGS) ×4
STRIP CLOSURE SKIN 1/2X4 (GAUZE/BANDAGES/DRESSINGS) ×3 IMPLANT
SUT MNCRL AB 4-0 PS2 18 (SUTURE) ×5 IMPLANT
SWABSTK COMLB BENZOIN TINCTURE (MISCELLANEOUS) ×2 IMPLANT
TOWEL OR 17X26 10 PK STRL BLUE (TOWEL DISPOSABLE) ×3 IMPLANT
TOWEL OR NON WOVEN STRL DISP B (DISPOSABLE) ×3 IMPLANT
TRAY LAPAROSCOPIC (CUSTOM PROCEDURE TRAY) ×3 IMPLANT
TROCAR BLADELESS OPT 5 100 (ENDOMECHANICALS) ×3 IMPLANT
TROCAR XCEL BLUNT TIP 100MML (ENDOMECHANICALS) ×3 IMPLANT
TROCAR XCEL NON-BLD 11X100MML (ENDOMECHANICALS) ×3 IMPLANT
TUBING INSUF HEATED (TUBING) IMPLANT

## 2017-02-08 NOTE — Anesthesia Preprocedure Evaluation (Signed)
Anesthesia Evaluation  Patient identified by MRN, date of birth, ID band Patient awake    Reviewed: Allergy & Precautions, NPO status , Patient's Chart, lab work & pertinent test results  Airway Mallampati: II  TM Distance: >3 FB Neck ROM: Full    Dental no notable dental hx.    Pulmonary asthma ,    Pulmonary exam normal breath sounds clear to auscultation       Cardiovascular negative cardio ROS Normal cardiovascular exam Rhythm:Regular Rate:Normal     Neuro/Psych Bipolar Disorder negative neurological ROS     GI/Hepatic negative GI ROS, Neg liver ROS,   Endo/Other  negative endocrine ROS  Renal/GU negative Renal ROS  negative genitourinary   Musculoskeletal negative musculoskeletal ROS (+)   Abdominal   Peds negative pediatric ROS (+)  Hematology negative hematology ROS (+)   Anesthesia Other Findings   Reproductive/Obstetrics negative OB ROS                             Anesthesia Physical Anesthesia Plan  ASA: II  Anesthesia Plan: General   Post-op Pain Management:    Induction: Intravenous  PONV Risk Score and Plan: 4 or greater and Ondansetron, Dexamethasone, Midazolam, Scopolamine patch - Pre-op and Propofol infusion  Airway Management Planned: Oral ETT  Additional Equipment:   Intra-op Plan:   Post-operative Plan: Extubation in OR  Informed Consent: I have reviewed the patients History and Physical, chart, labs and discussed the procedure including the risks, benefits and alternatives for the proposed anesthesia with the patient or authorized representative who has indicated his/her understanding and acceptance.   Dental advisory given  Plan Discussed with: CRNA  Anesthesia Plan Comments:         Anesthesia Quick Evaluation

## 2017-02-08 NOTE — Interval H&P Note (Signed)
History and Physical Interval Note:  02/08/2017 7:41 AM  Nancy Mckinney  has presented today for surgery, with the diagnosis of cholecystitis with cholelithiasis.  The various methods of treatment have been discussed with the patient and family. After consideration of risks, benefits and other options for treatment, the patient has consented to    Procedure(s): LAPAROSCOPIC CHOLECYSTECTOMY WITH INTRAOPERATIVE CHOLANGIOGRAM (N/A) as a surgical intervention .    The patient's history has been reviewed, patient examined, no change in status, stable for surgery.  I have reviewed the patient's chart and labs.  Questions were answered to the patient's satisfaction.    Earnstine Regal, MD, [facility] Office: [place]

## 2017-02-08 NOTE — Anesthesia Procedure Notes (Signed)
Procedure Name: Intubation Date/Time: 02/08/2017 8:03 AM Performed by: Montel Clock Pre-anesthesia Checklist: Patient identified, Emergency Drugs available, Suction available, Patient being monitored and Timeout performed Patient Re-evaluated:Patient Re-evaluated prior to induction Oxygen Delivery Method: Circle system utilized Preoxygenation: Pre-oxygenation with 100% oxygen Induction Type: IV induction and Rapid sequence Laryngoscope Size: Mac and 3 Grade View: Grade I Tube type: Oral Tube size: 7.0 mm Number of attempts: 1 Airway Equipment and Method: Stylet Placement Confirmation: ETT inserted through vocal cords under direct vision,  positive ETCO2 and breath sounds checked- equal and bilateral Secured at: 21 cm Tube secured with: Tape Dental Injury: Teeth and Oropharynx as per pre-operative assessment

## 2017-02-08 NOTE — Transfer of Care (Signed)
Immediate Anesthesia Transfer of Care Note  Patient: Nancy Mckinney  Procedure(s) Performed: Procedure(s): LAPAROSCOPIC CHOLECYSTECTOMY WITH INTRAOPERATIVE CHOLANGIOGRAM (N/A)  Patient Location: PACU  Anesthesia Type:General  Level of Consciousness:  sedated, patient cooperative and responds to stimulation  Airway & Oxygen Therapy:Patient Spontanous Breathing and Patient connected to face mask oxgen  Post-op Assessment:  Report given to PACU RN and Post -op Vital signs reviewed and stable  Post vital signs:  Reviewed and stable  Last Vitals:  Vitals:   02/08/17 0446 02/08/17 0700  BP: 105/72 116/82  Pulse: 60 60  Resp: 17 18  Temp: 36.8 C 41.5 C    Complications: No apparent anesthesia complications

## 2017-02-08 NOTE — Op Note (Signed)
Procedure Note  Pre-operative Diagnosis:  Cholelithiasis, cholecystitis, abdominal pain  Post-operative Diagnosis:  same  Surgeon:  Earnstine Regal, MD, FACS  Assistant:  none   Procedure:  Laparoscopic cholecystectomy with intra-operative cholangiography  Anesthesia:  General  Estimated Blood Loss:  minimal  Drains: none         Specimen: Gallbladder to pathology  Indications:  The patient is a 46 year old female who presents to the emergency department with severe right upper quadrant pain that started last night. In retrospect she feels as though she has been having some upper abdominal discomfort for the last 2-3 weeks. The pain is been associated with nausea and dry heaves. She denies any fevers or chills. She came to the emergency department where an ultrasound showed some gallbladder wall thickening with stones. Her liver functions were normal.  Patient was admitted by Dr. Marlou Starks and prepared for the operating room today.  Procedure Details:  The patient was seen in the pre-op holding area. The risks, benefits, complications, treatment options, and expected outcomes were previously discussed with the patient. The patient agreed with the proposed plan and has signed the informed consent form.  The patient was brought to the Operating Room, identified as Nancy Mckinney and the procedure verified as laparoscopic cholecystectomy with intraoperative cholangiography. A "time out" was completed and the above information confirmed.  The patient was placed in the supine position. Following induction of general anesthesia, the abdomen was prepped and draped in the usual aseptic fashion.  An incision was made in the skin near the umbilicus. The midline fascia was incised and the peritoneal cavity was entered and a Hasson canula was introduced under direct vision.  The Hasson canula was secured with a 0-Vicryl pursestring suture. Pneumoperitoneum was established with carbon dioxide. Additional  trocars were introduced under direct vision along the right costal margin in the midline, mid-clavicular line, and anterior axillary line.   The gallbladder was identified and the fundus grasped and retracted cephalad. Adhesions were taken down bluntly and the electrocautery was utilized as needed, taking care not to injure any adjacent structures. The infundibulum was grasped and retracted laterally, exposing the peritoneum overlying the triangle of Calot. The peritoneum was incised and structures exposed with blunt dissection. The cystic duct was clearly identified, bluntly dissected circumferentially, and clipped at the neck of the gallbladder.  An incision was made in the cystic duct and the cholangiogram catheter introduced. The catheter was secured using an ligaclip.  Real-time cholangiography was performed using C-arm fluoroscopy.  There was rapid filling of a normal caliber common bile duct.  There was reflux of contrast into the left and right hepatic ductal systems.  There was free flow distally into the duodenum without filling defect or obstruction.  The catheter was removed from the peritoneal cavity.  The cystic duct was then ligated with surgical clips and divided. The cystic artery was identified, dissected circumferentially, ligated with ligaclips, and divided.  The gallbladder was dissected away from the liver bed using the electrocautery for hemostasis. The gallbladder was completely removed from the liver and placed into an endocatch bag. The gallbladder was removed in the endocatch bag through the umbilical port site and submitted to pathology for review.  The right upper quadrant was irrigated and the gallbladder bed was inspected. Hemostasis was achieved with the electrocautery.  Pneumoperitoneum was released after viewing removal of the trocars with good hemostasis noted. The umbilical wound was irrigated and the fascia was then closed with the pursestring suture.  Local  anesthetic was infiltrated at all port sites. The skin incisions were closed with 4-0 Monocril subcuticular sutures and steri-strips and dressings were applied.  Instrument, sponge, and needle counts were correct at the conclusion of the case.  The patient was awakened from anesthesia and brought to the recovery room in stable condition.  The patient tolerated the procedure well.   Earnstine Regal, MD, [facility] Office: [phone]

## 2017-02-08 NOTE — Anesthesia Postprocedure Evaluation (Signed)
Anesthesia Post Note  Patient: Nancy Mckinney  Procedure(s) Performed: Procedure(s) (LRB): LAPAROSCOPIC CHOLECYSTECTOMY WITH INTRAOPERATIVE CHOLANGIOGRAM (N/A)     Patient location during evaluation: PACU Anesthesia Type: General Level of consciousness: awake and alert Pain management: pain level controlled Vital Signs Assessment: post-procedure vital signs reviewed and stable Respiratory status: spontaneous breathing, nonlabored ventilation, respiratory function stable and patient connected to nasal cannula oxygen Cardiovascular status: blood pressure returned to baseline and stable Postop Assessment: no signs of nausea or vomiting Anesthetic complications: no    Last Vitals:  Vitals:   02/08/17 0945 02/08/17 1000  BP: 115/73 128/79  Pulse: 88 84  Resp: 12 12  Temp:  36.6 C    Last Pain:  Vitals:   02/08/17 1000  TempSrc:   PainSc: Asleep                 Montez Hageman

## 2017-02-09 ENCOUNTER — Encounter (HOSPITAL_COMMUNITY): Payer: Self-pay | Admitting: Surgery

## 2017-02-09 MED ORDER — DOCUSATE SODIUM 100 MG PO CAPS
100.0000 mg | ORAL_CAPSULE | Freq: Every day | ORAL | Status: DC
  Administered 2017-02-09: 100 mg via ORAL
  Filled 2017-02-09: qty 1

## 2017-02-09 MED ORDER — HYDROCODONE-ACETAMINOPHEN 5-325 MG PO TABS: 1.0000 | ORAL_TABLET | ORAL | 0 refills | Status: DC | PRN

## 2017-02-09 MED ORDER — POLYETHYLENE GLYCOL 3350 17 G PO PACK
17.0000 g | PACK | Freq: Every day | ORAL | Status: DC
  Administered 2017-02-09: 17 g via ORAL
  Filled 2017-02-09: qty 1

## 2017-02-09 NOTE — Discharge Instructions (Signed)
Please arrive at least 30 min before your appointment to complete your check in paperwork.  If you are unable to arrive 30 min prior to your appointment time we may have to cancel or reschedule you.  LAPAROSCOPIC SURGERY: POST OP INSTRUCTIONS  1. DIET: Follow a light bland diet the first 24 hours after arrival home, such as soup, liquids, crackers, etc. Be sure to include lots of fluids daily. Avoid fast food or heavy meals as your are more likely to get nauseated. Eat a low fat the next few days after surgery.  2. Take your usually prescribed home medications unless otherwise directed. 3. PAIN CONTROL:  1. Pain is best controlled by a usual combination of three different methods TOGETHER:  1. Ice/Heat 2. Over the counter pain medication 3. Prescription pain medication 2. Most patients will experience some swelling and bruising around the incisions. Ice packs or heating pads (30-60 minutes up to 6 times a day) will help. Use ice for the first few days to help decrease swelling and bruising, then switch to heat to help relax tight/sore spots and speed recovery. Some people prefer to use ice alone, heat alone, alternating between ice & heat. Experiment to what works for you. Swelling and bruising can take several weeks to resolve.  3. It is helpful to take an over-the-counter pain medication regularly for the first few weeks. Choose one of the following that works best for you:  1. Naproxen (Aleve, etc) Two 220mg  tabs twice a day 2. Ibuprofen (Advil, etc) Three 200mg  tabs four times a day (every meal & bedtime) 3. Acetaminophen (Tylenol, etc) 500-650mg  four times a day (every meal & bedtime) 4. A prescription for pain medication (such as oxycodone, hydrocodone, etc) should be given to you upon discharge. Take your pain medication as prescribed. DO NOT TAKE acetaminophen or medicines containing acetaminophen (tylenol) while taking the [place].  1. If you are having problems/concerns with the prescription  medicine (does not control pain, nausea, vomiting, rash, itching, etc), please call us [phone] to see if we need to switch you to a different pain medicine that will work better for you and/or control your side effect better. 2. If you need a refill on your pain medication, please contact your pharmacy. They will contact our office to request authorization. Prescriptions will not be filled after 5 pm or on week-ends. 4. Avoid getting constipated. Between the surgery and the pain medications, it is common to experience some constipation. Increasing fluid intake and taking a fiber supplement (such as Metamucil, Citrucel, FiberCon, MiraLax, etc) 1-2 times a day regularly will usually help prevent this problem from occurring. A mild laxative (prune juice, Milk of Magnesia, MiraLax, etc) should be taken according to package directions if there are no bowel movements after 48 hours.  5. Watch out for diarrhea. If you have many loose bowel movements, simplify your diet to bland foods & liquids for a few days. Stop any stool softeners and decrease your fiber supplement. Switching to mild anti-diarrheal medications (Kayopectate, Pepto Bismol) can help. If this worsens or does not improve, please call us. 6. Wash / shower every day. You may remove your dressings and shower over the steri-strips 2 days after surgery. Continue to shower over incision(s) after the dressing is off. You may replace a dressing/Band-Aid to cover the incision for comfort if you wish. DO NOT pick off the steri-stips  7. ACTIVITIES as tolerated:  1. You may resume regular (light) daily activities beginning the next day--such  as daily self-care, walking, climbing stairs--gradually increasing activities as tolerated. If you can walk 30 minutes without difficulty, it is safe to try more intense activity such as jogging, treadmill, bicycling, low-impact aerobics, swimming, etc. 2. Save the most intensive and strenuous activity for last such  as sit-ups, heavy lifting, contact sports, etc Refrain from any heavy lifting or straining until you are off narcotics for pain control.  3. DO NOT PUSH THROUGH PAIN. Let pain be your guide: If it hurts to do something, don't do it. Pain is your body warning you to avoid that activity for another week until the pain goes down. 4. You may drive when you are no longer taking prescription pain medication, you can comfortably wear a seatbelt, and you can safely maneuver your car and apply brakes. 5. You may have sexual intercourse when it is comfortable.  8. FOLLOW UP in our office  1. Please call [facility] at [phone] to set up an appointment to see your surgeon in the office for a follow-up appointment approximately 2-3 weeks after your surgery. 2. Make sure that you call for this appointment the day you arrive home to insure a convenient appointment time.      10. IF YOU HAVE DISABILITY OR FAMILY LEAVE FORMS, BRING THEM TO THE               OFFICE FOR PROCESSING.   WHEN TO CALL us [phone]:  1. Poor pain control 2. Reactions / problems with new medications (rash/itching, nausea, etc)  3. Fever over 101.5 F (38.5 C) 4. Inability to urinate 5. Nausea and/or vomiting 6. Worsening swelling or bruising 7. Continued bleeding from incision. 8. Increased pain, redness, or drainage from the incision  The clinic staff is available to answer your questions during regular business hours (8:30am-5pm). Please dont hesitate to call and ask to speak to one of our nurses for clinical concerns.  If you have a medical emergency, go to the nearest emergency room or call 911.  A surgeon from [facility] is always on call at the [facility], [place], [place], [place], [place] [address] ?  MAIN: [phone] ? TOLL FREE: [phone] ?  FAX [phone]  [email or web address]

## 2017-02-09 NOTE — Discharge Summary (Signed)
[facility] Discharge Summary   Patient ID: Nancy Mckinney MRN: [id number] DOB/AGE: 1970/12/12 46 y.o.  Admit date: 02/07/2017 Discharge date: 02/09/2017  Admitting Diagnosis: Cholecystitis   Discharge Diagnosis Patient Active Problem List   Diagnosis Date Noted  . Cholecystitis with cholelithiasis 02/07/2017  . Bipolar disease, manic ([place]) 04/24/2014  . Major depressive disorder, recurrent episode, moderate ([place]) 03/21/2014  . Suicidal ideations 03/17/2014    Consultants none  Imaging: Dg Cholangiogram Operative  Result Date: 02/08/2017 CLINICAL DATA:  Cholelithiasis EXAM: INTRAOPERATIVE CHOLANGIOGRAM TECHNIQUE: Cholangiographic images from the C-arm fluoroscopic device were submitted for interpretation post-operatively. Please see the procedural report for the amount of contrast and the fluoroscopy time utilized. COMPARISON:  02/07/2017 FINDINGS: Intraoperative cholangiogram performed during the laparoscopic procedure. Mild biliary prominence. Biliary confluence, common hepatic duct and common bile duct are patent. Contrast drains into the duodenum. No biliary obstruction, stricture or significant filling defect. IMPRESSION: Mild biliary prominence without obstruction Electronically Signed   By: Jerilynn Mages.  Shick M.D.   On: 02/08/2017 09:17   US Abdomen Limited Ruq  Result Date: 02/07/2017 CLINICAL DATA:  Right upper quadrant pain.  Nausea and vomiting. EXAM: ULTRASOUND ABDOMEN LIMITED RIGHT UPPER QUADRANT COMPARISON:  None. FINDINGS: Gallbladder: The gallbladder wall is mildly thickened measuring 4 mm. Pericholecystic fluid noted. Multiple stones identified. Positive sonographic Percell Biello sign reported by the sonographer. Common bile duct: Diameter: 9 mm Liver: No focal lesion identified. Within normal limits in parenchymal echogenicity. IMPRESSION: 1. Imaging findings suggestive of cholecystitis including: Gallstones, positive sonographic Murphy's sign, pericholecystic fluid  and gallbladder wall thickening. Electronically Signed   By: Kerby Moors M.D.   On: 02/07/2017 13:50    Procedures Dr. Harlow Asa (02/08/17) - Laparoscopic Cholecystectomy with [place] Hospital Course:  Nancy Mckinney is a 46 year old female with a history of bipolar disease, depression and ETOH abuse (states she has not drank in 2 years) who presented to [facility] with abdominal pain.  Workup showed cholecystitis.  Patient was admitted and underwent procedure listed above.  Tolerated procedure well and was transferred to the floor.  Diet was advanced as tolerated.  On POD#1, the patient was voiding well, tolerating diet, ambulating well, pain well controlled, vital signs stable, incisions c/d/i and felt stable for discharge home.  Patient will follow up in our office in 2 weeks and knows to call with questions or concerns.  She will call to confirm appointment date/time.  Discussed f/u with GI regarding her liver findings (appearance of cirrhosis) during surgery per Dr. Harlow Asa.   Patient was discharged in good condition.  The [place] Substance controlled database was reviewed prior to prescribing narcotic pain medication to this patient.  Physical Exam: General:  Alert, NAD, pleasant, cooperative, well appearing Cardio: RRR, S1 & S2 normal, no murmur, rubs, gallops Resp: Effort normal, lungs CTA bilaterally, no wheezes, rales, rhonchi Abd:  Soft, ND, normal bowel sounds, mild generalized appropriate tenderness, no guarding, incisions C/D/I  Skin: warm and dry  Allergies as of 02/09/2017      Reactions   Cephalosporins Diarrhea      Medication List    STOP taking these medications   ibuprofen 800 MG tablet Commonly known as:  ADVIL,MOTRIN     TAKE these medications   albuterol 108 (90 Base) MCG/ACT inhaler Commonly known as:  PROVENTIL HFA;VENTOLIN HFA Inhale 1-2 puffs into the lungs every 4 (four) hours as needed for wheezing or shortness of breath.   HYDROcodone-acetaminophen  5-325 MG tablet Commonly known as:  NORCO/VICODIN  Take 1 tablet by mouth every 4 (four) hours as needed for moderate pain.   methocarbamol 500 MG tablet Commonly known as:  ROBAXIN Take 500 mg by mouth every 8 (eight) hours as needed for muscle spasms.   multivitamin tablet Take 1 tablet by mouth daily.   naproxen sodium 220 MG tablet Commonly known as:  ANAPROX Take 220 mg by mouth 2 (two) times daily with a meal.   phentermine 37.5 MG capsule Take 37.5 mg by mouth every morning.        Follow-up [facility], [place]. Go on 02/23/2017.   Specialty:  General Surgery Why:  your appointment date and time is 8/14 at 10:30 am, please arrive 30 minutes prior to complete paperwork Contact information: [address] [place] [phone]       Gastroenterology, [place]. Schedule an appointment as soon as possible for a visit in 2 week(s).   Why:  for follow up regarding your liver findings Contact information: [place] [address] [phone]           Signed: [facility] 02/09/2017, 8:52 AM Pager: [phone] Consults: [phone] Mon-Fri 7:00 am-4:30 pm Sat-Sun 7:00 am-11:30 am

## 2017-02-23 ENCOUNTER — Other Ambulatory Visit (HOSPITAL_COMMUNITY): Payer: Self-pay | Admitting: Gastroenterology

## 2017-02-23 DIAGNOSIS — K7469 Other cirrhosis of liver: Secondary | ICD-10-CM

## 2017-03-02 ENCOUNTER — Ambulatory Visit (HOSPITAL_COMMUNITY): Payer: Managed Care, Other (non HMO)

## 2017-03-02 ENCOUNTER — Encounter (HOSPITAL_COMMUNITY): Payer: Self-pay

## 2017-03-10 ENCOUNTER — Ambulatory Visit
Admission: RE | Admit: 2017-03-10 | Discharge: 2017-03-10 | Disposition: A | Discharge status: Home / Self Care | Payer: Managed Care, Other (non HMO) | Source: Ambulatory Visit | Attending: Gastroenterology | Admitting: Gastroenterology

## 2017-03-10 ENCOUNTER — Other Ambulatory Visit: Payer: Self-pay | Admitting: Gastroenterology

## 2017-03-10 ENCOUNTER — Ambulatory Visit (HOSPITAL_COMMUNITY)
Admission: RE | Admit: 2017-03-10 | Discharge: 2017-03-10 | Disposition: A | Discharge status: Home / Self Care | Payer: Managed Care, Other (non HMO) | Source: Ambulatory Visit | Attending: Gastroenterology | Admitting: Gastroenterology

## 2017-03-10 DIAGNOSIS — K5900 Constipation, unspecified: Secondary | ICD-10-CM | POA: Insufficient documentation

## 2017-03-10 DIAGNOSIS — K7469 Other cirrhosis of liver: Secondary | ICD-10-CM

## 2017-03-10 DIAGNOSIS — R109 Unspecified abdominal pain: Secondary | ICD-10-CM | POA: Insufficient documentation

## 2017-05-12 ENCOUNTER — Emergency Department (HOSPITAL_COMMUNITY)
Admission: EM | Admit: 2017-05-12 | Discharge: 2017-05-12 | Discharge status: Against Medical Advice | Payer: Managed Care, Other (non HMO) | Attending: Emergency Medicine | Admitting: Emergency Medicine

## 2017-05-12 ENCOUNTER — Encounter (HOSPITAL_COMMUNITY): Payer: Self-pay | Admitting: *Deleted

## 2017-05-12 DIAGNOSIS — Z5321 Procedure and treatment not carried out due to patient leaving prior to being seen by health care provider: Secondary | ICD-10-CM | POA: Diagnosis not present

## 2017-05-12 DIAGNOSIS — R109 Unspecified abdominal pain: Secondary | ICD-10-CM | POA: Diagnosis present

## 2017-05-12 NOTE — ED Notes (Addendum)
Pt called 2x with no response for vitals update and rooming.  RN notified,.

## 2017-05-12 NOTE — ED Triage Notes (Signed)
Pt complains of abdominal pain and bloating since last night. Pt states she has had intermittent abdominal abdominal pain since her gallbladder was removed 3 months ago. Pt states she has cirrhosis. Pt has had nausea, no diarrhea.

## 2017-05-13 HISTORY — PX: APPENDECTOMY: SHX54

## 2017-05-14 ENCOUNTER — Observation Stay (HOSPITAL_BASED_OUTPATIENT_CLINIC_OR_DEPARTMENT_OTHER)
Admission: EM | Admit: 2017-05-14 | Discharge: 2017-05-15 | Disposition: A | Discharge status: Home / Self Care | Payer: Managed Care, Other (non HMO) | Attending: General Surgery | Admitting: General Surgery

## 2017-05-14 ENCOUNTER — Emergency Department (HOSPITAL_BASED_OUTPATIENT_CLINIC_OR_DEPARTMENT_OTHER): Payer: Managed Care, Other (non HMO)

## 2017-05-14 ENCOUNTER — Encounter (HOSPITAL_BASED_OUTPATIENT_CLINIC_OR_DEPARTMENT_OTHER): Payer: Self-pay | Admitting: *Deleted

## 2017-05-14 ENCOUNTER — Emergency Department (HOSPITAL_COMMUNITY): Payer: Managed Care, Other (non HMO) | Admitting: Anesthesiology

## 2017-05-14 ENCOUNTER — Encounter (HOSPITAL_COMMUNITY)
Admission: EM | Disposition: A | Discharge status: Home / Self Care | Payer: Self-pay | Source: Home / Self Care | Attending: Emergency Medicine

## 2017-05-14 DIAGNOSIS — K3533 Acute appendicitis with perforation and localized peritonitis, with abscess: Secondary | ICD-10-CM | POA: Insufficient documentation

## 2017-05-14 DIAGNOSIS — Z79899 Other long term (current) drug therapy: Secondary | ICD-10-CM | POA: Insufficient documentation

## 2017-05-14 DIAGNOSIS — K7460 Unspecified cirrhosis of liver: Secondary | ICD-10-CM | POA: Diagnosis not present

## 2017-05-14 DIAGNOSIS — Z9851 Tubal ligation status: Secondary | ICD-10-CM | POA: Diagnosis not present

## 2017-05-14 DIAGNOSIS — Z6833 Body mass index (BMI) 33.0-33.9, adult: Secondary | ICD-10-CM | POA: Insufficient documentation

## 2017-05-14 DIAGNOSIS — I10 Essential (primary) hypertension: Secondary | ICD-10-CM | POA: Diagnosis not present

## 2017-05-14 DIAGNOSIS — Z791 Long term (current) use of non-steroidal anti-inflammatories (NSAID): Secondary | ICD-10-CM | POA: Diagnosis not present

## 2017-05-14 DIAGNOSIS — R1030 Lower abdominal pain, unspecified: Principal | ICD-10-CM | POA: Insufficient documentation

## 2017-05-14 DIAGNOSIS — K3580 Unspecified acute appendicitis: Secondary | ICD-10-CM

## 2017-05-14 DIAGNOSIS — J45909 Unspecified asthma, uncomplicated: Secondary | ICD-10-CM | POA: Diagnosis not present

## 2017-05-14 DIAGNOSIS — K353 Acute appendicitis with localized peritonitis, without perforation or gangrene: Secondary | ICD-10-CM | POA: Diagnosis present

## 2017-05-14 HISTORY — DX: Unspecified cirrhosis of liver: K74.60

## 2017-05-14 HISTORY — DX: Essential (primary) hypertension: I10

## 2017-05-14 HISTORY — PX: LAPAROSCOPIC APPENDECTOMY: SHX408

## 2017-05-14 HISTORY — DX: Unspecified viral hepatitis C without hepatic coma: B19.20

## 2017-05-14 LAB — CBC WITH DIFFERENTIAL/PLATELET
BASOS ABS: 0 10*3/uL (ref 0.0–0.1)
BASOS PCT: 1 %
EOS ABS: 0.1 10*3/uL (ref 0.0–0.7)
Eosinophils Relative: 2 %
HEMATOCRIT: 32.3 % — AB (ref 36.0–46.0)
HEMOGLOBIN: 10.3 g/dL — AB (ref 12.0–15.0)
Lymphocytes Relative: 30 %
Lymphs Abs: 1.3 10*3/uL (ref 0.7–4.0)
MCH: 25.2 pg — ABNORMAL LOW (ref 26.0–34.0)
MCHC: 31.9 g/dL (ref 30.0–36.0)
MCV: 79 fL (ref 78.0–100.0)
MONOS PCT: 9 %
Monocytes Absolute: 0.4 10*3/uL (ref 0.1–1.0)
NEUTROS ABS: 2.6 10*3/uL (ref 1.7–7.7)
NEUTROS PCT: 58 %
Platelets: 124 10*3/uL — ABNORMAL LOW (ref 150–400)
RBC: 4.09 MIL/uL (ref 3.87–5.11)
RDW: 15.3 % (ref 11.5–15.5)
WBC: 4.5 10*3/uL (ref 4.0–10.5)

## 2017-05-14 LAB — COMPREHENSIVE METABOLIC PANEL
ALK PHOS: 69 U/L (ref 38–126)
ALT: 36 U/L (ref 14–54)
ANION GAP: 5 (ref 5–15)
AST: 44 U/L — ABNORMAL HIGH (ref 15–41)
Albumin: 3.6 g/dL (ref 3.5–5.0)
BUN: 16 mg/dL (ref 6–20)
CALCIUM: 9 mg/dL (ref 8.9–10.3)
CHLORIDE: 108 mmol/L (ref 101–111)
CO2: 25 mmol/L (ref 22–32)
Creatinine, Ser: 0.78 mg/dL (ref 0.44–1.00)
GFR calc Af Amer: >60 mL/min (ref >60)
Glucose, Bld: 89 mg/dL (ref 65–99)
Potassium: 3.6 mmol/L (ref 3.5–5.1)
Sodium: 138 mmol/L (ref 135–145)
TOTAL PROTEIN: 7.9 g/dL (ref 6.5–8.1)
Total Bilirubin: 0.2 mg/dL — ABNORMAL LOW (ref 0.3–1.2)

## 2017-05-14 LAB — PROTIME-INR
INR: 1.02
PROTHROMBIN TIME: 13.3 s (ref 11.4–15.2)

## 2017-05-14 LAB — LIPASE, BLOOD: LIPASE: 29 U/L (ref 11–51)

## 2017-05-14 SURGERY — APPENDECTOMY, LAPAROSCOPIC
Anesthesia: General

## 2017-05-14 MED ORDER — IOPAMIDOL (ISOVUE-300) INJECTION 61%
100.0000 mL | Freq: Once | INTRAVENOUS | Status: AC | PRN
  Administered 2017-05-14: 100 mL via INTRAVENOUS

## 2017-05-14 MED ORDER — ALBUTEROL SULFATE (2.5 MG/3ML) 0.083% IN NEBU
2.5000 mg | INHALATION_SOLUTION | RESPIRATORY_TRACT | Status: DC | PRN
Start: 2017-05-14 — End: 2017-05-15

## 2017-05-14 MED ORDER — SUGAMMADEX SODIUM 200 MG/2ML IV SOLN
INTRAVENOUS | Status: AC
  Filled 2017-05-14: qty 2

## 2017-05-14 MED ORDER — TRAMADOL HCL 50 MG PO TABS: 50.0000 mg | ORAL_TABLET | Freq: Four times a day (QID) | ORAL | Status: DC | PRN

## 2017-05-14 MED ORDER — MIDAZOLAM HCL 2 MG/2ML IJ SOLN: 0.5000 mg | Freq: Once | INTRAMUSCULAR | Status: DC | PRN

## 2017-05-14 MED ORDER — ROCURONIUM BROMIDE 50 MG/5ML IV SOSY
PREFILLED_SYRINGE | INTRAVENOUS | Status: AC
  Filled 2017-05-14: qty 5

## 2017-05-14 MED ORDER — CIPROFLOXACIN IN D5W 400 MG/200ML IV SOLN
400.0000 mg | Freq: Once | INTRAVENOUS | Status: AC
  Administered 2017-05-14: 400 mg via INTRAVENOUS
  Filled 2017-05-14: qty 200

## 2017-05-14 MED ORDER — LIDOCAINE 2% (20 MG/ML) 5 ML SYRINGE
INTRAMUSCULAR | Status: DC | PRN
  Administered 2017-05-14: 100 mg via INTRAVENOUS

## 2017-05-14 MED ORDER — LACTATED RINGERS IR SOLN
Status: DC | PRN
  Administered 2017-05-14: 1000 mL

## 2017-05-14 MED ORDER — PROPOFOL 10 MG/ML IV BOLUS
INTRAVENOUS | Status: DC | PRN
  Administered 2017-05-14: 180 mg via INTRAVENOUS

## 2017-05-14 MED ORDER — PROPOFOL 10 MG/ML IV BOLUS
INTRAVENOUS | Status: AC
  Filled 2017-05-14: qty 20

## 2017-05-14 MED ORDER — ALBUTEROL SULFATE HFA 108 (90 BASE) MCG/ACT IN AERS
2.0000 | INHALATION_SPRAY | RESPIRATORY_TRACT | Status: DC | PRN
  Administered 2017-05-14: 2 via RESPIRATORY_TRACT
  Filled 2017-05-14: qty 6.7

## 2017-05-14 MED ORDER — ROCURONIUM BROMIDE 10 MG/ML (PF) SYRINGE
PREFILLED_SYRINGE | INTRAVENOUS | Status: DC | PRN
  Administered 2017-05-14: 30 mg via INTRAVENOUS

## 2017-05-14 MED ORDER — NAPROXEN SODIUM 275 MG PO TABS
275.0000 mg | ORAL_TABLET | Freq: Two times a day (BID) | ORAL | Status: DC | PRN
  Filled 2017-05-14: qty 1

## 2017-05-14 MED ORDER — ONDANSETRON HCL 4 MG/2ML IJ SOLN: 4.0000 mg | Freq: Once | INTRAMUSCULAR | Status: DC

## 2017-05-14 MED ORDER — ONDANSETRON HCL 4 MG/2ML IJ SOLN
INTRAMUSCULAR | Status: AC
  Filled 2017-05-14: qty 2

## 2017-05-14 MED ORDER — METRONIDAZOLE IN NACL 5-0.79 MG/ML-% IV SOLN
500.0000 mg | Freq: Once | INTRAVENOUS | Status: AC
  Administered 2017-05-14: 500 mg via INTRAVENOUS
  Filled 2017-05-14: qty 100

## 2017-05-14 MED ORDER — FENTANYL CITRATE (PF) 100 MCG/2ML IJ SOLN
INTRAMUSCULAR | Status: AC
  Filled 2017-05-14: qty 2

## 2017-05-14 MED ORDER — HYDROMORPHONE HCL 1 MG/ML IJ SOLN: 0.2500 mg | INTRAMUSCULAR | Status: DC | PRN

## 2017-05-14 MED ORDER — SODIUM CHLORIDE 0.9 % IV BOLUS (SEPSIS)
500.0000 mL | Freq: Once | INTRAVENOUS | Status: AC
  Administered 2017-05-14: 500 mL via INTRAVENOUS

## 2017-05-14 MED ORDER — ONDANSETRON HCL 4 MG/2ML IJ SOLN: 4.0000 mg | Freq: Four times a day (QID) | INTRAMUSCULAR | Status: DC | PRN

## 2017-05-14 MED ORDER — MEPERIDINE HCL 50 MG/ML IJ SOLN: 6.2500 mg | INTRAMUSCULAR | Status: DC | PRN

## 2017-05-14 MED ORDER — SUCCINYLCHOLINE CHLORIDE 200 MG/10ML IV SOSY
PREFILLED_SYRINGE | INTRAVENOUS | Status: DC | PRN
  Administered 2017-05-14: 140 mg via INTRAVENOUS

## 2017-05-14 MED ORDER — MIDAZOLAM HCL 2 MG/2ML IJ SOLN
INTRAMUSCULAR | Status: AC
  Filled 2017-05-14: qty 2

## 2017-05-14 MED ORDER — MORPHINE SULFATE (PF) 4 MG/ML IV SOLN: 4.0000 mg | Freq: Once | INTRAVENOUS | Status: DC

## 2017-05-14 MED ORDER — SODIUM CHLORIDE 0.9 % IV SOLN: Freq: Once | INTRAVENOUS | Status: DC

## 2017-05-14 MED ORDER — DEXAMETHASONE SODIUM PHOSPHATE 10 MG/ML IJ SOLN
INTRAMUSCULAR | Status: AC
  Filled 2017-05-14: qty 1

## 2017-05-14 MED ORDER — BUPIVACAINE-EPINEPHRINE 0.25% -1:200000 IJ SOLN
INTRAMUSCULAR | Status: DC | PRN
  Administered 2017-05-14: 20 mL

## 2017-05-14 MED ORDER — OXYCODONE HCL 5 MG PO TABS: 5.0000 mg | ORAL_TABLET | ORAL | Status: DC | PRN

## 2017-05-14 MED ORDER — LACTATED RINGERS IV SOLN
INTRAVENOUS | Status: DC
  Administered 2017-05-15: 06:00:00 via INTRAVENOUS

## 2017-05-14 MED ORDER — LIDOCAINE 2% (20 MG/ML) 5 ML SYRINGE
INTRAMUSCULAR | Status: AC
  Filled 2017-05-14: qty 5

## 2017-05-14 MED ORDER — FENTANYL CITRATE (PF) 100 MCG/2ML IJ SOLN
INTRAMUSCULAR | Status: DC | PRN
  Administered 2017-05-14 (×2): 50 ug via INTRAVENOUS

## 2017-05-14 MED ORDER — BUPIVACAINE-EPINEPHRINE (PF) 0.25% -1:200000 IJ SOLN
INTRAMUSCULAR | Status: AC
  Filled 2017-05-14: qty 30

## 2017-05-14 MED ORDER — ONDANSETRON HCL 4 MG/2ML IJ SOLN
INTRAMUSCULAR | Status: DC | PRN
  Administered 2017-05-14: 4 mg via INTRAVENOUS

## 2017-05-14 MED ORDER — MIDAZOLAM HCL 5 MG/5ML IJ SOLN
INTRAMUSCULAR | Status: DC | PRN
  Administered 2017-05-14: 2 mg via INTRAVENOUS

## 2017-05-14 MED ORDER — MORPHINE SULFATE (PF) 2 MG/ML IV SOLN
2.0000 mg | INTRAVENOUS | Status: DC | PRN
  Administered 2017-05-15 (×2): 2 mg via INTRAVENOUS
  Filled 2017-05-14 (×2): qty 1

## 2017-05-14 MED ORDER — PHENTERMINE HCL 37.5 MG PO CAPS: 37.5000 mg | ORAL_CAPSULE | ORAL | Status: DC

## 2017-05-14 MED ORDER — DEXAMETHASONE SODIUM PHOSPHATE 10 MG/ML IJ SOLN
INTRAMUSCULAR | Status: DC | PRN
  Administered 2017-05-14: 10 mg via INTRAVENOUS

## 2017-05-14 MED ORDER — PROMETHAZINE HCL 25 MG/ML IJ SOLN: 6.2500 mg | INTRAMUSCULAR | Status: DC | PRN

## 2017-05-14 MED ORDER — FENTANYL CITRATE (PF) 100 MCG/2ML IJ SOLN
50.0000 ug | Freq: Once | INTRAMUSCULAR | Status: AC
  Administered 2017-05-14: 50 ug via INTRAVENOUS
  Filled 2017-05-14: qty 2

## 2017-05-14 MED ORDER — ONDANSETRON 4 MG PO TBDP: 4.0000 mg | ORAL_TABLET | Freq: Four times a day (QID) | ORAL | Status: DC | PRN

## 2017-05-14 MED ORDER — ENOXAPARIN SODIUM 40 MG/0.4ML ~~LOC~~ SOLN: 40.0000 mg | Freq: Every day | SUBCUTANEOUS | Status: DC

## 2017-05-14 SURGICAL SUPPLY — 39 items
ADH SKN CLS APL DERMABOND .7 (GAUZE/BANDAGES/DRESSINGS)
APPLIER CLIP 5 13 M/L LIGAMAX5 (MISCELLANEOUS)
APPLIER CLIP ROT 10 11.4 M/L (STAPLE)
APR CLP MED LRG 11.4X10 (STAPLE)
APR CLP MED LRG 5 ANG JAW (MISCELLANEOUS)
BAG SPEC RTRVL LRG 6X4 10 (ENDOMECHANICALS) ×1
CABLE HIGH FREQUENCY MONO STRZ (ELECTRODE) IMPLANT
CHLORAPREP W/TINT 26ML (MISCELLANEOUS) ×3 IMPLANT
CLIP APPLIE 5 13 M/L LIGAMAX5 (MISCELLANEOUS) IMPLANT
CLIP APPLIE ROT 10 11.4 M/L (STAPLE) IMPLANT
COVER SURGICAL LIGHT HANDLE (MISCELLANEOUS) ×3 IMPLANT
CUTTER FLEX LINEAR 45M (STAPLE) IMPLANT
DECANTER SPIKE VIAL GLASS SM (MISCELLANEOUS) ×3 IMPLANT
DERMABOND ADVANCED (GAUZE/BANDAGES/DRESSINGS)
DERMABOND ADVANCED .7 DNX12 (GAUZE/BANDAGES/DRESSINGS) IMPLANT
DRAPE LAPAROSCOPIC ABDOMINAL (DRAPES) ×3 IMPLANT
ELECT REM PT RETURN 15FT ADLT (MISCELLANEOUS) ×3 IMPLANT
GLOVE BIOGEL PI IND STRL 7.5 (GLOVE) ×1 IMPLANT
GLOVE BIOGEL PI INDICATOR 7.5 (GLOVE) ×2
GLOVE ECLIPSE 7.5 STRL STRAW (GLOVE) ×11 IMPLANT
GOWN STRL REUS W/TWL XL LVL3 (GOWN DISPOSABLE) ×6 IMPLANT
KIT BASIN OR (CUSTOM PROCEDURE TRAY) ×3 IMPLANT
PAD POSITIONING PINK XL (MISCELLANEOUS) ×1 IMPLANT
POUCH SPECIMEN RETRIEVAL 10MM (ENDOMECHANICALS) ×3 IMPLANT
RELOAD 45 VASCULAR/THIN (ENDOMECHANICALS) IMPLANT
RELOAD STAPLE 45 2.5 WHT GRN (ENDOMECHANICALS) IMPLANT
RELOAD STAPLE 45 3.5 BLU ETS (ENDOMECHANICALS) IMPLANT
RELOAD STAPLE TA45 3.5 REG BLU (ENDOMECHANICALS) ×3 IMPLANT
SCISSORS LAP 5X35 DISP (ENDOMECHANICALS) ×1 IMPLANT
SET IRRIG TUBING LAPAROSCOPIC (IRRIGATION / IRRIGATOR) ×3 IMPLANT
SHEARS HARMONIC ACE PLUS 36CM (ENDOMECHANICALS) ×3 IMPLANT
SLEEVE XCEL OPT CAN 5 100 (ENDOMECHANICALS) ×3 IMPLANT
SUT MNCRL AB 4-0 PS2 18 (SUTURE) ×3 IMPLANT
TOWEL OR 17X26 10 PK STRL BLUE (TOWEL DISPOSABLE) ×3 IMPLANT
TRAY FOLEY W/METER SILVER 16FR (SET/KITS/TRAYS/PACK) ×3 IMPLANT
TRAY LAPAROSCOPIC (CUSTOM PROCEDURE TRAY) ×3 IMPLANT
TROCAR BLADELESS OPT 5 100 (ENDOMECHANICALS) ×3 IMPLANT
TROCAR XCEL BLUNT TIP 100MML (ENDOMECHANICALS) ×3 IMPLANT
TUBING INSUF HEATED (TUBING) ×3 IMPLANT

## 2017-05-14 NOTE — Transfer of Care (Signed)
Immediate Anesthesia Transfer of Care Note  Patient: Nancy Mckinney  Procedure(s) Performed: APPENDECTOMY LAPAROSCOPIC (N/A )  Patient Location: PACU  Anesthesia Type:General  Level of Consciousness: awake, alert  and oriented  Airway & Oxygen Therapy: Patient Spontanous Breathing and Patient connected to face mask oxygen  Post-op Assessment: Report given to RN and Post -op Vital signs reviewed and stable  Post vital signs: Reviewed and stable  Last Vitals:  Vitals:   05/14/17 1627 05/14/17 1905  BP: 128/80 136/81  Pulse: 80 70  Resp: 16 16  Temp:  36.8 C  SpO2: 100% 100%    Last Pain:  Vitals:   05/14/17 1905  TempSrc: Oral  PainSc:          Complications: No apparent anesthesia complications

## 2017-05-14 NOTE — Anesthesia Procedure Notes (Signed)
Procedure Name: Intubation Date/Time: 05/14/2017 9:05 PM Performed by: Noralyn Pick D Pre-anesthesia Checklist: Patient identified, Emergency Drugs available, Suction available and Patient being monitored Patient Re-evaluated:Patient Re-evaluated prior to induction Oxygen Delivery Method: Circle system utilized Preoxygenation: Pre-oxygenation with 100% oxygen Induction Type: IV induction, Rapid sequence and Cricoid Pressure applied Laryngoscope Size: Mac and 4 Grade View: Grade I Tube type: Oral Tube size: 7.5 mm Number of attempts: 1 Airway Equipment and Method: Stylet Placement Confirmation: ETT inserted through vocal cords under direct vision,  positive ETCO2 and breath sounds checked- equal and bilateral Secured at: 21 cm Tube secured with: Tape Dental Injury: Teeth and Oropharynx as per pre-operative assessment

## 2017-05-14 NOTE — H&P (Signed)
Nancy Mckinney is an 46 y.o. female.   Chief Complaint: Abdominal pain  HPI: The patient is a 46 year old female who about 3 days ago began to develop gradually progressive lower abdominal pain.  This has progressively worsened over the last 3 days.  She describes pain across her lower abdomen but greater on the right than the left.  She has noted abdominal bloating and distention in association with this.  She has had nausea and dry heaves for a couple of days.  The pain is worse with any motion.  She has some chronic GI issues related to cramping and intermittent diarrhea and constipation but nothing similar to this.  Denies fever chills or change in bowel movements with this current illness.  No GU symptoms.  Recent diagnosis of cirrhosis with history of alcohol use not active and hepatitis C but underwent an uneventful laparoscopic cholecystectomy in July of this year.  She presented to [facility] with CT findings of acute appendicitis as described below.  Past Medical History:  Diagnosis Date  . Asthma   . Bipolar 1 disorder ([place])   . Depression   . Hepatitis C   . Hypertension   . Irritable bowel syndrome   . Liver cirrhosis [facility])     Past Surgical History:  Procedure Laterality Date  . ABDOMINAL SURGERY    . CHOLECYSTECTOMY N/A 02/08/2017   Procedure: LAPAROSCOPIC CHOLECYSTECTOMY WITH INTRAOPERATIVE CHOLANGIOGRAM;  Surgeon: Armandina Gemma, MD;  Location: [facility];  Service: General;  Laterality: N/A;    History reviewed. No pertinent family history. Social History:  reports that she has never smoked. She has never used smokeless tobacco. She reports that she drinks alcohol. She reports that she does not use drugs.  Allergies:  Allergies  Allergen Reactions  . Cephalosporins Diarrhea     Current Facility-Administered Medications  Medication Dose Route Frequency Provider Last Rate Last Dose  . 0.9 %  sodium chloride infusion   Intravenous Once Duffy Bruce, MD      .  albuterol (PROVENTIL) (2.5 MG/3ML) 0.083% nebulizer solution 2.5 mg  2.5 mg Nebulization Q4H PRN Duffy Bruce, MD      . morphine 4 MG/ML injection 4 mg  4 mg Intravenous Once Duffy Bruce, MD      . ondansetron [facility]) injection 4 mg  4 mg Intravenous Once Duffy Bruce, MD       Current Outpatient Prescriptions  Medication Sig Dispense Refill  . albuterol (PROVENTIL HFA;VENTOLIN HFA) 108 (90 BASE) MCG/ACT inhaler Inhale 1-2 puffs into the lungs every 4 (four) hours as needed for wheezing or shortness of breath. 1 Inhaler 0  . Multiple Vitamin (MULTIVITAMIN) tablet Take 1 tablet by mouth daily.    . naproxen sodium (ANAPROX) 220 MG tablet Take 220 mg by mouth 2 (two) times daily as needed (pain).     . phentermine 37.5 MG capsule Take 37.5 mg by mouth every morning.    Marland Kitchen HYDROcodone-acetaminophen (NORCO/VICODIN) 5-325 MG tablet Take 1 tablet by mouth every 4 (four) hours as needed for moderate pain. 20 tablet 0  . polyethylene glycol-electrolytes (NULYTELY/GOLYTELY) 420 g solution Take 4,000 mLs by mouth once.        Results for orders placed or performed during the hospital encounter of 05/14/17 (from the past 48 hour(s))  Comprehensive metabolic panel     Status: Abnormal   Collection Time: 05/14/17  1:17 PM  Result Value Ref Range   Sodium 138 135 - 145 mmol/L   Potassium 3.6  3.5 - 5.1 mmol/L   Chloride 108 101 - 111 mmol/L   CO2 25 22 - 32 mmol/L   Glucose, Bld 89 65 - 99 mg/dL   BUN 16 6 - 20 mg/dL   Creatinine, Ser 0.78 0.44 - 1.00 mg/dL   Calcium 9.0 8.9 - 10.3 mg/dL   Total Protein 7.9 6.5 - 8.1 g/dL   Albumin 3.6 3.5 - 5.0 g/dL   AST 44 (H) 15 - 41 U/L   ALT 36 14 - 54 U/L   Alkaline Phosphatase 69 38 - 126 U/L   Total Bilirubin 0.2 (L) 0.3 - 1.2 mg/dL   GFR calc non Af Amer >60 >60 mL/min   GFR calc Af Amer >60 >60 mL/min    Comment: (NOTE) The eGFR has been calculated using the CKD EPI equation. This calculation has not been validated in all clinical  situations. eGFR's persistently <60 mL/min signify possible Chronic Kidney Disease.    Anion gap 5 5 - 15  Lipase, blood     Status: None   Collection Time: 05/14/17  1:17 PM  Result Value Ref Range   Lipase 29 11 - 51 U/L  CBC with Differential     Status: Abnormal   Collection Time: 05/14/17  1:17 PM  Result Value Ref Range   WBC 4.5 4.0 - 10.5 K/uL   RBC 4.09 3.87 - 5.11 MIL/uL   Hemoglobin 10.3 (L) 12.0 - 15.0 g/dL   HCT 32.3 (L) 36.0 - 46.0 %   MCV 79.0 78.0 - 100.0 fL   MCH 25.2 (L) 26.0 - 34.0 pg   MCHC 31.9 30.0 - 36.0 g/dL   RDW 15.3 11.5 - 15.5 %   Platelets 124 (L) 150 - 400 K/uL   Neutrophils Relative % 58 %   Neutro Abs 2.6 1.7 - 7.7 K/uL   Lymphocytes Relative 30 %   Lymphs Abs 1.3 0.7 - 4.0 K/uL   Monocytes Relative 9 %   Monocytes Absolute 0.4 0.1 - 1.0 K/uL   Eosinophils Relative 2 %   Eosinophils Absolute 0.1 0.0 - 0.7 K/uL   Basophils Relative 1 %   Basophils Absolute 0.0 0.0 - 0.1 K/uL  Protime-INR     Status: None   Collection Time: 05/14/17  1:17 PM  Result Value Ref Range   Prothrombin Time 13.3 11.4 - 15.2 seconds   INR 1.02    Ct Abdomen Pelvis W Contrast  Result Date: 05/14/2017 CLINICAL DATA:  Abdominal pain, unspecified. Abdominal pain and bloating for 3 days. Mid abdominal pain. Personal history of irritable bowel syndrome. Personal history of cirrhosis. EXAM: CT ABDOMEN AND PELVIS WITH CONTRAST TECHNIQUE: Multidetector CT imaging of the abdomen and pelvis was performed using the standard protocol following bolus administration of intravenous contrast. CONTRAST:  156m ISOVUE-300 IOPAMIDOL (ISOVUE-300) INJECTION 61% COMPARISON:  Abdominal ultrasound 03/10/2017. FINDINGS: Lower chest: The lung bases are clear. Minimal atelectasis is present. The heart size is normal. No significant pleural or pericardial effusion is present. Hepatobiliary: There is some nodularity liver, compatible with known cirrhosis. 3 subcentimeter hypodense lesions are present.  The largest is near the down, measuring 5 mm. These likely represent simple cysts. The common bile duct is within normal limits following cholecystectomy. Pancreas: Unremarkable. No pancreatic ductal dilatation or surrounding inflammatory changes. Spleen: Spleen is somewhat prominent, measuring 12.3 cm. No discrete lesions are present. Adrenals/Urinary Tract: Adrenal glands are normal bilaterally. The a 2 cm benign cyst is present posteriorly in the right kidney. Kidneys and ureters  are within normal limits. The urinary bladder is within normal limits. Stomach/Bowel: Stomach and duodenum are within normal limits. The small bowel is unremarkable. The appendix is enlarged with surrounding inflammatory changes. Appendix: Location: RetrocecalDiameter: 9.8 mmAppendicolith: Not presentMucosal hyper-enhancement: PresentExtraluminal gas: Not presentPeriappendiceal collection: Not present Laboratory changes surrounding inflamed appendix. There is some free fluid extending into the anatomic pelvis. The ascending and transverse colon are within normal limits. The descending and sigmoid colon are unremarkable. Vascular/Lymphatic: No significant vascular findings are present. No enlarged abdominal or pelvic lymph nodes. Reproductive: The uterus is somewhat edematous. Small fibroids are noted. Left adnexal follicles are within normal limits for age. Other: No abdominal wall hernia is present. A small amount of free fluid is present as described. Musculoskeletal: A remote T12 superior endplate fracture is present. There is focal kyphosis. Retropulsed bone is noted along the fracture plane. Retropulsed bone is worse right than left. The fracture is worse on the right. Vertebral body heights and alignment are otherwise maintained. IMPRESSION: 1. Acute appendicitis as described. 2. No other focal inflammatory changes. 3. Evidence for hepatic cirrhosis including nodular liver and borderline splenomegaly. These results were called by  telephone at the time of interpretation on 05/14/2017 at 2:10 pm to Dr. Davonna Belling , who verbally acknowledged these results. Electronically Signed   By: San Morelle M.D.   On: 05/14/2017 14:16    Review of Systems  Constitutional: Negative for chills and fever.  Respiratory: Negative for cough, shortness of breath and wheezing.   Gastrointestinal: Positive for abdominal pain, constipation, nausea and vomiting. Negative for blood in stool and melena.  Genitourinary: Negative.   Psychiatric/Behavioral: Positive for substance abuse (History of heavy alcohol abuse, none recently).    Blood pressure 136/81, pulse 70, temperature 98.2 F (36.8 C), temperature source Oral, resp. rate 16, height '5\' 5"'$  (1.651 m), weight 90.7 kg (200 lb), last menstrual period 04/21/2017, SpO2 100 %. Physical Exam  General: Alert, well-developed female, in no acute distress Skin: Warm and dry without rash or infection. HEENT: No palpable masses or thyromegaly. Sclera nonicteric. Pupils equal round and reactive.  Lungs: Breath sounds clear and equal without increased work of breathing Cardiovascular: Regular rate and rhythm without murmur. No JVD or edema.  Abdomen: Possible mild distention.  Mild diffuse tenderness and localized more marked right lower quadrant tenderness with guarding but no obvious peritoneal signs.. No masses palpable. No organomegaly. No palpable hernias. Extremities: No edema or joint swelling or deformity. No chronic venous stasis changes. Neurologic: Alert and fully oriented.  Affect normal.  No gross motor or sensory deficits.  Assessment/Plan 3-day history of lower abdominal pain, nausea vomiting with localized right lower tenderness on exam.  I have personally reviewed her CT scan which shows an apparent acutely inflamed retrocecal appendix.  I discussed the diagnosis with the patient.  I recommended proceeding with laparoscopic appendectomy.  I discussed alternative of  antibiotic treatment.  I discussed the nature of surgery and expected recovery as well as risks of anesthetic complications, bleeding, infection, visceral injury or possible need for open surgery.  She has received appropriate IV antibiotics.  Patient understands and agrees to proceed.  Edward Jolly, MD 05/14/2017, 8:11 PM

## 2017-05-14 NOTE — ED Triage Notes (Signed)
Abdominal pain and bloating. She went to [facility] 2 days ago but left before being seen due to wait time.

## 2017-05-14 NOTE — Op Note (Signed)
Preoperative Diagnosis: Acute appendicitis, unspecified acute appendicitis type [K35.80]  Postoprative Diagnosis: Acute appendicitis, without perforation or gangrene   Procedure: Procedure(s): APPENDECTOMY LAPAROSCOPIC   Surgeon: Excell Seltzer T   Assistants: None  Anesthesia:  General endotracheal anesthesia  Indications: Patient is a 46 year old female with 3 days of worsening lower abdominal pain right greater than left with nausea and vomiting.  CT scan has shown acute appendicitis without apparent complication.  I recommend proceeding with laparoscopic appendectomy.  The procedure and risks were discussed in detail documented elsewhere and she agrees to proceed.    Procedure Detail: Patient was brought to the operating room, placed in supine position on the operating table, and general endotracheal anesthesia induced.  She had received preoperative broad-spectrum IV antibiotics.  PAS were in place.  Foley catheter was placed.  The abdomen was widely sterilely prepped and draped.  Patient timeout was performed and correct procedure verified.  The previous small infraumbilical incision was used after anesthetizing with Marcaine and dissection carried down to the midline fascia which was incised for 1 cm and the peritoneum entered under direct vision.  Through a mattress suture of 0 Vicryl the Hassan trocar was placed and pneumoperitoneum established.  Under direct vision a 5 mm trocar was placed in the epigastrium and a second 5 mm trocar in the left lower quadrant.  There were inflammatory adhesions of the terminal ileum up over the cecum and appendix which were carefully taken down with blunt dissection.  The appendix was lying just beneath the cecum with inflammatory adhesions to the pelvic sidewall.  The appendix was carefully bluntly mobilized.  It was severely inflamed with exudate but no gangrene or perforation.  The mesoappendix was sequentially divided with harmonic scalpel.  The  appendix was completely freed down onto the tip of the cecum which was relatively free of inflammation.  The appendix was then divided from the tip of the cecum with a single firing of the Endo GIA 45 mm stapler.  The appendix was placed in an Endo Catch bag and brought out through the umbilical incision.  The abdomen was carefully inspected and thoroughly irrigated and there was no bleeding or evidence of injury.  The liver was noted to be moderately nodular.  No other abnormality seen.  Trochars were removed and all CO2 evacuated and the mattress suture secured at the umbilicus.  Skin incisions were closed with subcuticular Monocryl and Dermabond.  Sponge needle and instrument counts were correct.    Findings: Acute appendicitis without perforation or gangrene Moderately nodular liver consistent with cirrhosis  Estimated Blood Loss:  Minimal         Drains: None  Blood Given: none          Specimens: Appendix        Complications:  * No complications entered in OR log *         Disposition: PACU - hemodynamically stable.         Condition: stable

## 2017-05-14 NOTE — ED Notes (Signed)
ED Provider at bedside. 

## 2017-05-14 NOTE — ED Provider Notes (Signed)
[facility] EMERGENCY DEPARTMENT Provider Note   CSN: [id number] Arrival date & time: 05/14/17  1217     History   Chief Complaint Chief Complaint  Patient presents with  . Abdominal Pain    HPI Nancy Mckinney is a 46 y.o. female.  HPI Patient presents with abdominal pain.  States that around a month ago she had her gallbladder out.  States then she has had some difficulty with abdominal pain.  Has come and gone during that time but worse over the last 2-3 days.  It is in the lower abdomen.  States she is bloated.  Has had some mild change in stool.  Some nausea.  No fevers.  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  Has seen Dr. Paulita Fujita from [place] for that and is scheduled for a colonoscopy and EGD.  At this is reported to evaluate the abdominal pain that she has been having.  She has a history of hepatitis C and states she will get treated for that after the endoscopies.  No fevers.  Pain potentially is worse with eating.  It is dull.  Worse with certain movements.  States she had a bowel movement earlier today and it did not really help with the pain. Past Medical History:  Diagnosis Date  . Asthma   . Bipolar 1 disorder ([place])   . Depression   . Hypertension   . Irritable bowel syndrome   . Liver cirrhosis [facility])     Patient Active Problem List   Diagnosis Date Noted  . Cholecystitis with cholelithiasis 02/07/2017  . Bipolar disease, manic ([place]) 04/24/2014  . Major depressive disorder, recurrent episode, moderate ([place]) 03/21/2014  . Suicidal ideations 03/17/2014    Past Surgical History:  Procedure Laterality Date  . ABDOMINAL SURGERY    . CHOLECYSTECTOMY N/A 02/08/2017   Procedure: LAPAROSCOPIC CHOLECYSTECTOMY WITH INTRAOPERATIVE CHOLANGIOGRAM;  Surgeon: Armandina Gemma, MD;  Location: [facility];  Service: General;  Laterality: N/A;    OB History    No data available       Home Medications    Prior to Admission medications   Medication Sig  Start Date End Date Taking? Authorizing Provider  albuterol (PROVENTIL HFA;VENTOLIN HFA) 108 (90 BASE) MCG/ACT inhaler Inhale 1-2 puffs into the lungs every 4 (four) hours as needed for wheezing or shortness of breath. 11/16/14  Yes Linton Flemings, MD  Multiple Vitamin (MULTIVITAMIN) tablet Take 1 tablet by mouth daily.   Yes [provider]  HYDROcodone-acetaminophen (NORCO/VICODIN) 5-325 MG tablet Take 1 tablet by mouth every 4 (four) hours as needed for moderate pain. 02/09/17   Focht, Fraser Din, PA  methocarbamol (ROBAXIN) 500 MG tablet Take 500 mg by mouth every 8 (eight) hours as needed for muscle spasms.    [provider]  naproxen sodium (ANAPROX) 220 MG tablet Take 220 mg by mouth 2 (two) times daily with a meal.    [provider]  phentermine 37.5 MG capsule Take 37.5 mg by mouth every morning.    [provider]    Family History No family history on file.  Social History Social History  Substance Use Topics  . Smoking status: Never Smoker  . Smokeless tobacco: Never Used  . Alcohol use Yes     Comment: had 1 can of beer today.      Allergies   Cephalosporins   Review of Systems Review of Systems  Constitutional: Positive for appetite change. Negative for chills and fever.  HENT: Negative for congestion.   Respiratory: Negative for shortness of breath.   Cardiovascular: Negative for chest pain.  Gastrointestinal: Positive for abdominal pain.  Genitourinary: Negative for dysuria and enuresis.  Musculoskeletal: Negative for back pain.  Skin: Negative for rash.  Neurological: Negative for seizures.  Hematological: Negative for adenopathy.  Psychiatric/Behavioral: Negative for confusion.     Physical Exam Updated Vital Signs BP 131/83   Pulse 78   Temp 99.2 F (37.3 C) (Oral)   Resp 18   Ht 5\' 5"  (1.651 m)   Wt 90.7 kg (200 lb)   LMP 04/21/2017   SpO2 100%   BMI 33.28 kg/m   Physical Exam  Constitutional: She appears  well-developed.  HENT:  Head: Atraumatic.  Neck: Neck supple.  Cardiovascular: Normal rate.   Pulmonary/Chest: Effort normal.  Abdominal: There is tenderness.  Moderate left lower quadrant tenderness without rebound or guarding.  No hernias palpated.  Musculoskeletal: She exhibits no edema.  Neurological: She is alert.  Skin: Skin is warm.  Psychiatric: She has a normal mood and affect.     ED Treatments / Results  Labs (all labs ordered are listed, but only abnormal results are displayed) Labs Reviewed  COMPREHENSIVE METABOLIC PANEL - Abnormal; Notable for the following:       Result Value   AST 44 (*)    Total Bilirubin 0.2 (*)    All other components within normal limits  CBC WITH DIFFERENTIAL/PLATELET - Abnormal; Notable for the following:    Hemoglobin 10.3 (*)    HCT 32.3 (*)    MCH 25.2 (*)    Platelets 124 (*)    All other components within normal limits  LIPASE, BLOOD    EKG  EKG Interpretation None       Radiology Ct Abdomen Pelvis W Contrast  Result Date: 05/14/2017 CLINICAL DATA:  Abdominal pain, unspecified. Abdominal pain and bloating for 3 days. Mid abdominal pain. Personal history of irritable bowel syndrome. Personal history of cirrhosis. EXAM: CT ABDOMEN AND PELVIS WITH CONTRAST TECHNIQUE: Multidetector CT imaging of the abdomen and pelvis was performed using the standard protocol following bolus administration of intravenous contrast. CONTRAST:  173mL ISOVUE-300 IOPAMIDOL (ISOVUE-300) INJECTION 61% COMPARISON:  Abdominal ultrasound 03/10/2017. FINDINGS: Lower chest: The lung bases are clear. Minimal atelectasis is present. The heart size is normal. No significant pleural or pericardial effusion is present. Hepatobiliary: There is some nodularity liver, compatible with known cirrhosis. 3 subcentimeter hypodense lesions are present. The largest is near the down, measuring 5 mm. These likely represent simple cysts. The common bile duct is within normal  limits following cholecystectomy. Pancreas: Unremarkable. No pancreatic ductal dilatation or surrounding inflammatory changes. Spleen: Spleen is somewhat prominent, measuring 12.3 cm. No discrete lesions are present. Adrenals/Urinary Tract: Adrenal glands are normal bilaterally. The a 2 cm benign cyst is present posteriorly in the right kidney. Kidneys and ureters are within normal limits. The urinary bladder is within normal limits. Stomach/Bowel: Stomach and duodenum are within normal limits. The small bowel is unremarkable. The appendix is enlarged with surrounding inflammatory changes. Appendix: Location: RetrocecalDiameter: 9.8 mmAppendicolith: Not presentMucosal hyper-enhancement: PresentExtraluminal gas: Not presentPeriappendiceal collection: Not present Laboratory changes surrounding inflamed appendix. There is some free fluid extending into the anatomic pelvis. The ascending and transverse colon are within normal limits. The descending and sigmoid colon are unremarkable. Vascular/Lymphatic: No significant vascular findings are present. No enlarged abdominal or pelvic lymph nodes. Reproductive: The uterus is somewhat edematous. Small fibroids are noted. Left adnexal follicles  are within normal limits for age. Other: No abdominal wall hernia is present. A small amount of free fluid is present as described. Musculoskeletal: A remote T12 superior endplate fracture is present. There is focal kyphosis. Retropulsed bone is noted along the fracture plane. Retropulsed bone is worse right than left. The fracture is worse on the right. Vertebral body heights and alignment are otherwise maintained. IMPRESSION: 1. Acute appendicitis as described. 2. No other focal inflammatory changes. 3. Evidence for hepatic cirrhosis including nodular liver and borderline splenomegaly. These results were called by telephone at the time of interpretation on 05/14/2017 at 2:10 pm to Dr. Davonna Belling , who verbally acknowledged these  results. Electronically Signed   By: San Morelle M.D.   On: 05/14/2017 14:16    Procedures Procedures (including critical care time)  Medications Ordered in ED Medications  sodium chloride 0.9 % bolus 500 mL (0 mLs Intravenous Stopped 05/14/17 1421)  iopamidol (ISOVUE-300) 61 % injection 100 mL (100 mLs Intravenous Contrast Given 05/14/17 1349)     Initial Impression / Assessment and Plan / ED Course  I have reviewed the triage vital signs and the nursing notes.  Pertinent labs & imaging results that were available during my care of the patient were reviewed by me and considered in my medical decision making (see chart for details).     Patient with abdominal pain.  Labs reassuring but CT scan shows appendicitis.  Will transfer to general surgery.  Final Clinical Impressions(s) / ED Diagnoses   Final diagnoses:  Acute appendicitis, unspecified acute appendicitis type    New Prescriptions New Prescriptions   No medications on file     Davonna Belling, MD 05/14/17 1425

## 2017-05-14 NOTE — Progress Notes (Signed)
Received pt from PACU, via stretcher. A&O, laughing and talking with staff. No acute distress noted.

## 2017-05-14 NOTE — Anesthesia Preprocedure Evaluation (Addendum)
Anesthesia Evaluation  Patient identified by MRN, date of birth, ID band Patient awake    Reviewed: Allergy & Precautions, NPO status , Patient's Chart, lab work & pertinent test results  History of Anesthesia Complications Negative for: history of anesthetic complications  Airway Mallampati: I  TM Distance: >3 FB Neck ROM: Full    Dental  (+) Dental Advisory Given, Missing, Chipped   Pulmonary asthma (uses inhaler frequently) ,    breath sounds clear to auscultation       Cardiovascular (-) hypertension(-) angina Rhythm:Regular Rate:Normal     Neuro/Psych Anxiety Depression Bipolar Disorder negative neurological ROS     GI/Hepatic GERD  ,(+) Cirrhosis       , Hepatitis -, C  Endo/Other  Morbid obesity  Renal/GU negative Renal ROS     Musculoskeletal   Abdominal (+) + obese,   Peds  Hematology plt 124k   Anesthesia Other Findings   Reproductive/Obstetrics LMP 04/21/17 S/p BTL                            Anesthesia Physical Anesthesia Plan  ASA: II  Anesthesia Plan: General   Post-op Pain Management:    Induction: Rapid sequence  PONV Risk Score and Plan: 4 or greater and Ondansetron, Dexamethasone, Midazolam and Scopolamine patch - Pre-op  Airway Management Planned: Oral ETT  Additional Equipment:   Intra-op Plan:   Post-operative Plan: Extubation in OR  Informed Consent: I have reviewed the patients History and Physical, chart, labs and discussed the procedure including the risks, benefits and alternatives for the proposed anesthesia with the patient or authorized representative who has indicated his/her understanding and acceptance.   Dental advisory given  Plan Discussed with: CRNA and Surgeon  Anesthesia Plan Comments: (Plan routine monitors, GETA)       Anesthesia Quick Evaluation

## 2017-05-14 NOTE — Progress Notes (Signed)
PACU nurse reports that patient had left personal items/belongings in ER prior to surgery. ER personal notified re: having patients belongings sent to floor. Response was "Let me see if I can locate them."

## 2017-05-14 NOTE — ED Provider Notes (Signed)
46 year old female transferred from med center for acute appendicitis.  No evidence of abscess formation.  Patient has been given IV antibiotics.  She is n.p.o.  She understands reason for transfer and admission.  Will be admitted to general surgery.   Duffy Bruce, MD 05/14/17 1910

## 2017-05-15 MED ORDER — HYDROCODONE-ACETAMINOPHEN 5-325 MG PO TABS: 1.0000 | ORAL_TABLET | ORAL | 0 refills | Status: DC | PRN

## 2017-05-15 NOTE — Discharge Instructions (Signed)
[facility] ______[facility] °LAPAROSCOPIC SURGERY: POST OP INSTRUCTIONS °Always review your discharge instruction sheet given to you by the facility where your surgery was performed. °IF YOU HAVE DISABILITY OR FAMILY LEAVE FORMS, YOU MUST BRING THEM TO THE OFFICE FOR PROCESSING.   °DO NOT GIVE THEM TO YOUR DOCTOR. ° °1. A prescription for pain medication may be given to you upon discharge.  Take your pain medication as prescribed, if needed.  If narcotic pain medicine is not needed, then you may take acetaminophen (Tylenol) or ibuprofen (Advil) as needed. °2. Take your usually prescribed medications unless otherwise directed. °3. If you need a refill on your pain medication, please contact your pharmacy.  They will contact our office to request authorization. Prescriptions will not be filled after 5pm or on week-ends. °4. You should follow a light diet the first few days after arrival home, such as soup and crackers, etc.  Be sure to include lots of fluids daily. °5. Most patients will experience some swelling and bruising in the area of the incisions.  Ice packs will help.  Swelling and bruising can take several days to resolve.  °6. It is common to experience some constipation if taking pain medication after surgery.  Increasing fluid intake and taking a stool softener (such as Colace) will usually help or prevent this problem from occurring.  A mild laxative (Milk of Magnesia or Miralax) should be taken according to package instructions if there are no bowel movements after 48 hours. °7. Unless discharge instructions indicate otherwise, you may remove your bandages 24-48 hours after surgery, and you may shower at that time.  You may have steri-strips (small skin tapes) in place directly over the incision.  These strips should be left on the skin for 7-10 days.  If your surgeon used skin glue on the incision, you may shower in 24 hours.  The glue will flake off over the next 2-3 weeks.  Any sutures or  staples will be removed at the office during your follow-up visit. °8. ACTIVITIES:  You may resume regular (light) daily activities beginning the next day--such as daily self-care, walking, climbing stairs--gradually increasing activities as tolerated.  You may have sexual intercourse when it is comfortable.  Refrain from any heavy lifting or straining until approved by your doctor. °a. You may drive when you are no longer taking prescription pain medication, you can comfortably wear a seatbelt, and you can safely maneuver your car and apply brakes. °b. RETURN TO WORK:  __________________________________________________________ °9. You should see your doctor in the office for a follow-up appointment approximately 2-3 weeks after your surgery.  Make sure that you call for this appointment within a day or two after you arrive home to insure a convenient appointment time. °10. OTHER INSTRUCTIONS: __________________________________________________________________________________________________________________________ __________________________________________________________________________________________________________________________ °WHEN TO CALL YOUR DOCTOR: °1. Fever over 101.0 °2. Inability to urinate °3. Continued bleeding from incision. °4. Increased pain, redness, or drainage from the incision. °5. Increasing abdominal pain ° °The clinic staff is available to answer your questions during regular business hours.  Please don’t hesitate to call and ask to speak to one of the nurses for clinical concerns.  If you have a medical emergency, go to the nearest emergency room or call 911.  A surgeon from [facility] is always on call at the hospital. °[address], [address], [place], [place]  [address] ? [address] [address], [place], [place]   [address] °[phone] ? [phone] ? FAX [phone] °Web site:   [email or web address] °

## 2017-05-15 NOTE — Discharge Summary (Signed)
  Patient ID: Nancy Mckinney [id number] 46 y.o. June 22, 1971  05/14/2017  Discharge date and time: 05/15/2017   Admitting Physician: Excell Seltzer T  Discharge Physician: Excell Seltzer T  Admission Diagnoses: Acute appendicitis, with localized peritonitis no gangrene or perforation   Discharge Diagnoses: Same  Operations: Procedure(s): APPENDECTOMY LAPAROSCOPIC  Admission Condition: fair  Discharged Condition: good  Indication for Admission: Patient is a 46 year old female who presents with 3 days of steadily worsening lower abdominal pain and vomiting.  CT scan showed evidence of acute appendicitis.  She was admitted for emergency appendectomy  Hospital Course: Patient received preoperative broad-spectrum IV antibiotics and underwent an uncomplicated laparoscopic appendectomy.  She was noted to have moderate cirrhotic changes of her liver.  No ascites.  The following morning her pain is completely relieved.  No fever or tenderness.  Tolerating a liquid diet.  Felt ready for discharge.  Disposition: Home  Patient Instructions:  Allergies as of 05/15/2017      Reactions   Cephalosporins Diarrhea      Medication List    TAKE these medications   albuterol 108 (90 Base) MCG/ACT inhaler Commonly known as:  PROVENTIL HFA;VENTOLIN HFA Inhale 1-2 puffs into the lungs every 4 (four) hours as needed for wheezing or shortness of breath.   HYDROcodone-acetaminophen 5-325 MG tablet Commonly known as:  NORCO/VICODIN Take 1 tablet by mouth every 4 (four) hours as needed for moderate pain.   multivitamin tablet Take 1 tablet by mouth daily.   naproxen sodium 220 MG tablet Commonly known as:  ALEVE Take 220 mg by mouth 2 (two) times daily as needed (pain).   phentermine 37.5 MG capsule Take 37.5 mg by mouth every morning.   polyethylene glycol-electrolytes 420 g solution Commonly known as:  NuLYTELY/GoLYTELY Take 4,000 mLs by mouth once.       Activity: activity as  tolerated Diet: regular diet Wound Care: none needed  Follow-up:  With Dr. Excell Seltzer in 2 weeks.  Signed: Edward Jolly MD, FACS  05/15/2017, 9:00 AM

## 2017-05-15 NOTE — Anesthesia Postprocedure Evaluation (Signed)
Anesthesia Post Note  Patient: Nancy Mckinney  Procedure(s) Performed: APPENDECTOMY LAPAROSCOPIC (N/A )     Patient location during evaluation: PACU Anesthesia Type: General Level of consciousness: awake and alert, oriented and patient cooperative Pain management: pain level controlled Vital Signs Assessment: post-procedure vital signs reviewed and stable Respiratory status: spontaneous breathing, nonlabored ventilation, respiratory function stable and patient connected to nasal cannula oxygen Cardiovascular status: blood pressure returned to baseline and stable Postop Assessment: no apparent nausea or vomiting Anesthetic complications: no    Last Vitals:  Vitals:   05/15/17 0225 05/15/17 0600  BP: 127/68 128/75  Pulse: 88 91  Resp: 16 18  Temp: 36.9 C 37 C  SpO2: 98% 100%    Last Pain:  Vitals:   05/15/17 0600  TempSrc: Oral  PainSc:                  Casi Westerfeld,E. Deyci Gesell

## 2017-05-15 NOTE — Progress Notes (Signed)
Assessment unchanged. Pt verbalized understanding of dc instructions through teach back including follow up care and when to call the doctor. Discharged via foot per request accompanied by husband and Suezanne Jacquet, nursing student to front entrance.

## 2017-05-15 NOTE — Progress Notes (Signed)
Patient ID: Nancy Mckinney, female   DOB: 12/09/1970, 46 y.o.   MRN: [id number] 1 Day Post-Op   Subjective: Feels "great".  No pain.  Tolerating clear liquids without nausea.  Objective: Vital signs in last 24 hours: Temp:  [97.8 F (36.6 C)-99.2 F (37.3 C)] 98.6 F (37 C) (11/03 0600) Pulse Rate:  [70-94] 91 (11/03 0600) Resp:  [10-20] 18 (11/03 0600) BP: (127-149)/(68-92) 128/75 (11/03 0600) SpO2:  [98 %-100 %] 100 % (11/03 0600) Weight:  [90.7 kg (200 lb)] 90.7 kg (200 lb) (11/02 1224)    Intake/Output from previous day: 11/02 0701 - 11/03 0700 In: 1100 [I.V.:1100] Out: 720 [Urine:700; Blood:20] Intake/Output this shift: No intake/output data recorded.  General appearance: alert, cooperative and no distress GI: normal findings: soft, non-tender Incision/Wound: No erythema or drainage  Lab Results:   Recent Labs  05/14/17 1317  WBC 4.5  HGB 10.3*  HCT 32.3*  PLT 124*   BMET  Recent Labs  05/14/17 1317  NA 138  K 3.6  CL 108  CO2 25  GLUCOSE 89  BUN 16  CREATININE 0.78  CALCIUM 9.0     Studies/Results: Ct Abdomen Pelvis W Contrast  Result Date: 05/14/2017 CLINICAL DATA:  Abdominal pain, unspecified. Abdominal pain and bloating for 3 days. Mid abdominal pain. Personal history of irritable bowel syndrome. Personal history of cirrhosis. EXAM: CT ABDOMEN AND PELVIS WITH CONTRAST TECHNIQUE: Multidetector CT imaging of the abdomen and pelvis was performed using the standard protocol following bolus administration of intravenous contrast. CONTRAST:  161mL ISOVUE-300 IOPAMIDOL (ISOVUE-300) INJECTION 61% COMPARISON:  Abdominal ultrasound 03/10/2017. FINDINGS: Lower chest: The lung bases are clear. Minimal atelectasis is present. The heart size is normal. No significant pleural or pericardial effusion is present. Hepatobiliary: There is some nodularity liver, compatible with known cirrhosis. 3 subcentimeter hypodense lesions are present. The largest is near the down,  measuring 5 mm. These likely represent simple cysts. The common bile duct is within normal limits following cholecystectomy. Pancreas: Unremarkable. No pancreatic ductal dilatation or surrounding inflammatory changes. Spleen: Spleen is somewhat prominent, measuring 12.3 cm. No discrete lesions are present. Adrenals/Urinary Tract: Adrenal glands are normal bilaterally. The a 2 cm benign cyst is present posteriorly in the right kidney. Kidneys and ureters are within normal limits. The urinary bladder is within normal limits. Stomach/Bowel: Stomach and duodenum are within normal limits. The small bowel is unremarkable. The appendix is enlarged with surrounding inflammatory changes. Appendix: Location: RetrocecalDiameter: 9.8 mmAppendicolith: Not presentMucosal hyper-enhancement: PresentExtraluminal gas: Not presentPeriappendiceal collection: Not present Laboratory changes surrounding inflamed appendix. There is some free fluid extending into the anatomic pelvis. The ascending and transverse colon are within normal limits. The descending and sigmoid colon are unremarkable. Vascular/Lymphatic: No significant vascular findings are present. No enlarged abdominal or pelvic lymph nodes. Reproductive: The uterus is somewhat edematous. Small fibroids are noted. Left adnexal follicles are within normal limits for age. Other: No abdominal wall hernia is present. A small amount of free fluid is present as described. Musculoskeletal: A remote T12 superior endplate fracture is present. There is focal kyphosis. Retropulsed bone is noted along the fracture plane. Retropulsed bone is worse right than left. The fracture is worse on the right. Vertebral body heights and alignment are otherwise maintained. IMPRESSION: 1. Acute appendicitis as described. 2. No other focal inflammatory changes. 3. Evidence for hepatic cirrhosis including nodular liver and borderline splenomegaly. These results were called by telephone at the time of  interpretation on 05/14/2017 at 2:10 pm to Dr. Ovid Curd  PICKERING , who verbally acknowledged these results. Electronically Signed   By: San Morelle M.D.   On: 05/14/2017 14:16    Anti-infectives: Anti-infectives    Start     Dose/Rate Route Frequency Ordered Stop   05/14/17 1430  ciprofloxacin (CIPRO) IVPB 400 mg     400 mg 200 mL/hr over 60 Minutes Intravenous  Once 05/14/17 1425 05/14/17 1553   05/14/17 1430  metroNIDAZOLE (FLAGYL) IVPB 500 mg     500 mg 100 mL/hr over 60 Minutes Intravenous  Once 05/14/17 1425 05/14/17 1706      Assessment/Plan: Doing very well following laparoscopic appendectomy for acute appendicitis.  Okay for discharge.    LOS: 0 days    Nancy Mckinney 05/15/2017

## 2017-05-16 ENCOUNTER — Encounter (HOSPITAL_COMMUNITY): Payer: Self-pay | Admitting: General Surgery

## 2017-05-17 ENCOUNTER — Encounter (HOSPITAL_COMMUNITY): Payer: Self-pay | Admitting: General Surgery

## 2017-07-02 ENCOUNTER — Other Ambulatory Visit: Payer: Self-pay | Admitting: Gastroenterology

## 2017-07-02 NOTE — Progress Notes (Unsigned)
History of Present Illness  General:  46/female(transfer of care from Dr.Outlaw) with IDA(ferritin of 6.5, iron saturation 7%, TIBC 446, Hb 11.1, MCV 77), HCV positive genotype 1a, viral load 1,480,000 IU/ml, normal IgA and tTG IgA. Normal CMP(normal alpha 1 antitrypsin, AMA, ANA, ASMA(20), HBsAg negative, HBcIgM indeterminate, HBcAb positive, HBsAb reactive?natural immunity). USG and hepatic elastography showed F3/F4 fibrosis, no focal lesion, patent portal vein with normal direction of flow. She was also being treated for constipation on her last visit with linaclotide 290 mcg a day. She was supposed to be scheduled for an EGD and colonoscopy in 10/18. She has extreme fatigue since her cholecystectomy(02/09/17) and has been gaining weight 10 lbs since. She has appendectomy in 05/23/17. She has noted that she usually has 1 Bm in 7 days, consistency of stool is like round ball/pellets. She tried linzess 290 mcg, but it caused cramping and bloating and did not work after 1 day. She has tried miralax and other OTC laxatives without much relief. She denies nausea or vomiting. SHe developed constipation for the past 15 years since hemorrhoidectomy at [facility]. She had tried fiber/metamucil, dulcolax, stool softners with minimal relief. She denies use of fast food, breakfast is eggs/bacon/wheat toast, she takes almonds, she drinks 40 oz of water in 8 hours, for supper she may eat meat and vegetables, normally breakfast is her only meal. Denies IVDA, may have acquired it sexually 4 years ago, she had blood transfusion 25 years from miscarriage in [place], she has multiple tattooes(some done son's friend at home). Never been hepatitis C. Denies jaundice, ascites, encephalopathy, hematemesis. No prior EGD /colonoscopy. There is no family history of colon cancer. SHe does have heavy menstrual loss, she finishes 36 tampons in 3-4 days, she passes a lot of clots during her menstrual cycles.   Current Medications   Not-Taking   Phentermine HCl 37.5 MG Capsule 1 capsule Orally Once a day   MVI ? 1 tablet by mouth once a day   TriLyte(PEG 3350-KCl-Na Bicarb-NaCl) 420 GM Solution Reconstituted as directed Orally as directed   Hydrochlorothiazide 12.5 MG Tablet 1 tablet in the morning Orally Once a day   Medication List reviewed and reconciled with the patient    Past Medical History  Asthma.   HTN.    Surgical History  cholecystectomy   hemorrhoidectomy   breast implant   face reconstruction due to almost being kill   appendectomy    Family History  Father: alive  Mother: alive, diagnosed with Diabetes, Hypertension  Maternal Grand Mother: Diabetes  No Family History of Colon Cancer, Polyps, or Liver Disease.   Social History  General:  Tobacco use  cigarettes: Never smoked Tobacco history last updated 02/23/2017 no Alcohol.  no Recreational drug use.  OCCUPATION: Customer service rep.    Allergies  N.K.D.A.   Hospitalization/Major Diagnostic Procedure  Cholecystectomy 02/07/17  appendectomy 05/2017   Review of Systems  CONSTITUTIONAL:  Chills No. Fatigue YES. Fever No. Insomnia No. Night sweats YES. Weight change YES.  HEENT:  Change in vision No. Double vision No . Hoarseness No. Nose bleeds No. sore throat No. Sinus Problems No. Glaucoma No.  CARDIOLOGY:  ByPass Surgery No. Poor Circulation No. Artificial Heart Valves No. High blood pressure No. History of Heart attack No. Irregular heart beat No. Known coronary artery disease No. Pacemaker/Defibrillator No.  RESPIRATORY:  Shortness of breath No. Cough No. Excessive Sputum No. Using Oxygen No. Asthma YES. Bronchitis No. Pneumonia No. Sleep Apnea No.  UROLOGY:  Interstitials Cystitis  No. Incontinence No. Blood in urine No. Difficulty urinating No. Kidney disease No. Kidney stones No.  GASTROENTEROLOGY:  Abdominal pain YES. Acid reflux No. Black stools No. Bloating/belching No. Change in bowel habits No. Constipation YES.  Dark tarry stools No. Diarrhea No. Difficulty swallowing No. Heartburn No. Incontinence of stool YES. Indigestion: No. Lactose intolerance YES. Nausea No. Rectal bleeding No. Vomiting No. Hepatitis/yellow jaundice No. History of Ulcers No. Use of Pain Medication No. Previous Colonoscopy No.  MUSCULOSKELETAL:  joint pain No. Arthritis No. Joint Replacement No.  NEUROLOGY:  Dizziness No. Fainting No. Headache No. Paralysis No. Seizures No. Strokes No. Weakness No. Alzheimer's No.  SKIN:  Rash No. Bruises easily YES.  ENDOCRINOLOGY:  Diabetes No. High cholesterol No. Thyroid disorder No.  HEMATOLOGY/LYMPH:  Abnormal bleeding No. Anemia YES. Enlarged lymph nodes No. Past blood transfusion No. Swollen glands No. Blood Clots No. Using Blood Thinners No.  INFECTIOUS DISEASE:  HIV/AIDS No. Tuberculosis No . Hepatitis YES. Sexually Transmitted Disease No. MRSA No.  GI PROCEDURE:  no Pacemaker/ AICD, no. no Artificial heart valves. no MI/heart attack. no Abnormal heart rhythm. no Angina. no CVA. Hypertension YES. no Hypotension. Asthma, COPD YES, Asthma. no Sleep apnea. no Seizure disorders. Artificial joints YES. no Severe DJD. no Diabetes. no Significant headaches. no Vertigo. Depression/anxiety YES, Both. no Abnormal bleeding. no Kidney Disease. no Liver disease, no. no Chance of pregnancy. no Blood transfusion. no Method of Birth Control. no Birth control pills.  GU/GYN:  Pregnant Now No. Trying to conceive No. Use Birth Control No. Heavy Periods No.      Vital Signs  Wt 207.7, Wt change 5.6 lb, Ht 65, BMI 34.56, Temp 98.3, Pulse sitting 78, BP sitting 147/87.   Examination  Gastroenterology:: GENERAL APPEARANCE: Well developed, overweight, no active distress, pleasant, no acute distress.  EYES: Lids and conjunctiva normal. Sclera normal, pupils equal and reactive .  ORAL CAVITY: Lips, teeth and gums are normal. Pharynx, tongue, mucosa normal .  SCLERA: anicteric .  NECK Full ROM, trachea  midline, no thyromegaly or masses .  CARDIOVASCULAR PMI LS border. Normal RRR w/o murmers or gallops. No peripheral edema .  RESPIRATORY Breath sounds normal. Respiration even and unlabored .  ABDOMEN No masses palpated. Liver and spleen not palpated, normal. Bowel sounds normal, Abdomen not distended .  EXTREMITIES: No edema, pulses intact .  NEURO: normal strength and reflexes, cranial nerves II-XII grossly intact, normal gait .  PSYCH: mood/affect normal .     Assessments   1. Chronic hepatitis C without hepatic coma - B18.2 (Primary)   2. Iron deficiency anemia due to chronic blood loss - D50.0   3. Constipation, unspecified constipation type - K59.00   Treatment  1. Chronic hepatitis C without hepatic coma  Start Harvoni Tablets, 90mg /400mg , 1 tablet, By Mouth, Once a Day, 28 days, 28 Tablet, Refills 2 Notes: Treatment naive, non-cirrhotic, staged F3/F4 on ultrasound elastography. Will get approval to start Harvoni 1 pill a day for 12 weeks. Discussed about the common adverse effects such as fatigue, headache, nausea and insomnia.  During therapy CBC, serum creatinine and GFR along with hepatic panel needs to be monitored after 4 weeks of therapy, HCV viral load testing to be done after 4 weeks of therapy and at 12 weeks after completion of therapy. If hep C viral load is detected at 2 weeks, repeat testing is done 2 weeks after additional therapy at week 6.  When patient gets approved for Harvoni will need labs for  CBC, CMP, HBV PCR DNA(ensure no chronic hepatitis/latent HBV as patient had HBcAb positibe,although HBsAG was negative). .    2. Iron deficiency anemia due to chronic blood loss  IMAGING: Colonoscopy    Whitfield,Dia 07/02/2017 09:07:56 AM > spoke with Vickie-scheduled for 08/24/17 at [facility] at 11:30am.  IMAGING: Esophagoscopy    Whitfield,Dia 07/02/2017 09:07:56 AM > spoke with Vickie-scheduled for 08/24/17 at [facility] at 11:30am.   Notes: Discussed with patient that she  will have worsening of constipation with iron supplements. As her Hb is above 11, I do not recommend IV iron therapy at this time. Will perform a diagnostic EGD and possibel SB biopsies. Also, if she is found to have varices durign EGD that needs banding, I have discussed with the patient regarding associated risks and benefits. Advised patient to take metamucil 1 tablespoon BID for 10 days before scheduled colonoscopy. Advised patient to increase intake of fruits, vegetables and whole grains and take atleast 60 oz of water a day.  Referral To: Reason:egd w/possible banding-colon-propofol-spoke with Vickie-[facility]-#[id number]    3. Constipation, unspecified constipation type  Start Amitiza Capsule, 24 MCG, 1 capsule with food, Orally, Twice a day, 90 days, 180 Capsule, Refills 1 Start Magnesium Capsule, 500 MG, 2 capsules, Orally, Once a day, 90 days, 180 Capsule, Refills 3 Notes:  Advised patient to start amitiza 24 mcg BID and take magnesium pills 500 mg X 2 at night.

## 2017-08-06 ENCOUNTER — Encounter (HOSPITAL_COMMUNITY): Payer: Self-pay

## 2017-08-09 ENCOUNTER — Other Ambulatory Visit: Payer: Self-pay

## 2017-08-09 ENCOUNTER — Encounter (HOSPITAL_COMMUNITY): Payer: Self-pay | Admitting: Emergency Medicine

## 2017-08-24 ENCOUNTER — Ambulatory Visit (HOSPITAL_COMMUNITY): Payer: Managed Care, Other (non HMO) | Admitting: Anesthesiology

## 2017-08-24 ENCOUNTER — Other Ambulatory Visit: Payer: Self-pay

## 2017-08-24 ENCOUNTER — Ambulatory Visit (HOSPITAL_COMMUNITY)
Admission: RE | Admit: 2017-08-24 | Discharge: 2017-08-24 | Disposition: A | Discharge status: Home / Self Care | Payer: Managed Care, Other (non HMO) | Source: Ambulatory Visit | Attending: Gastroenterology | Admitting: Gastroenterology

## 2017-08-24 ENCOUNTER — Encounter (HOSPITAL_COMMUNITY)
Admission: RE | Disposition: A | Discharge status: Home / Self Care | Payer: Self-pay | Source: Ambulatory Visit | Attending: Gastroenterology

## 2017-08-24 ENCOUNTER — Encounter (HOSPITAL_COMMUNITY): Payer: Self-pay | Admitting: *Deleted

## 2017-08-24 DIAGNOSIS — D500 Iron deficiency anemia secondary to blood loss (chronic): Secondary | ICD-10-CM | POA: Diagnosis present

## 2017-08-24 DIAGNOSIS — K3189 Other diseases of stomach and duodenum: Secondary | ICD-10-CM | POA: Diagnosis not present

## 2017-08-24 DIAGNOSIS — Z79899 Other long term (current) drug therapy: Secondary | ICD-10-CM | POA: Diagnosis not present

## 2017-08-24 DIAGNOSIS — K635 Polyp of colon: Secondary | ICD-10-CM | POA: Diagnosis not present

## 2017-08-24 DIAGNOSIS — K759 Inflammatory liver disease, unspecified: Secondary | ICD-10-CM | POA: Insufficient documentation

## 2017-08-24 DIAGNOSIS — K5900 Constipation, unspecified: Secondary | ICD-10-CM | POA: Diagnosis not present

## 2017-08-24 DIAGNOSIS — J45909 Unspecified asthma, uncomplicated: Secondary | ICD-10-CM | POA: Diagnosis not present

## 2017-08-24 DIAGNOSIS — I10 Essential (primary) hypertension: Secondary | ICD-10-CM | POA: Insufficient documentation

## 2017-08-24 DIAGNOSIS — B182 Chronic viral hepatitis C: Secondary | ICD-10-CM | POA: Diagnosis not present

## 2017-08-24 HISTORY — PX: COLONOSCOPY: SHX5424

## 2017-08-24 HISTORY — PX: ESOPHAGOGASTRODUODENOSCOPY: SHX5428

## 2017-08-24 SURGERY — EGD (ESOPHAGOGASTRODUODENOSCOPY)
Anesthesia: Monitor Anesthesia Care

## 2017-08-24 MED ORDER — SODIUM CHLORIDE 0.9 % IV SOLN: INTRAVENOUS | Status: DC

## 2017-08-24 MED ORDER — PROPOFOL 10 MG/ML IV BOLUS
INTRAVENOUS | Status: AC
  Filled 2017-08-24: qty 20

## 2017-08-24 MED ORDER — LACTATED RINGERS IV SOLN
INTRAVENOUS | Status: DC
  Administered 2017-08-24: 11:00:00 via INTRAVENOUS

## 2017-08-24 MED ORDER — PROPOFOL 10 MG/ML IV BOLUS
INTRAVENOUS | Status: DC | PRN
  Administered 2017-08-24 (×4): 20 mg via INTRAVENOUS

## 2017-08-24 MED ORDER — PROPOFOL 500 MG/50ML IV EMUL
INTRAVENOUS | Status: DC | PRN
  Administered 2017-08-24: 120 ug/kg/min via INTRAVENOUS

## 2017-08-24 MED ORDER — LIDOCAINE 2% (20 MG/ML) 5 ML SYRINGE
INTRAMUSCULAR | Status: DC | PRN
  Administered 2017-08-24: 100 mg via INTRAVENOUS

## 2017-08-24 NOTE — H&P (Signed)
History of Present Illness  General:  46/female(transfer of care from Dr.Outlaw) with IDA(ferritin of 6.5, iron saturation 7%, TIBC 446, Hb 11.1, MCV 77), HCV positive genotype 1a, viral load 1,480,000 IU/ml, normal IgA and tTG IgA. Normal CMP(normal alpha 1 antitrypsin, AMA, ANA, ASMA(20), HBsAg negative, HBcIgM indeterminate, HBcAb positive, HBsAb reactive?natural immunity). USG and hepatic elastography showed F3/F4 fibrosis, no focal lesion, patent portal vein with normal direction of flow. She was also being treated for constipation on her last visit with linaclotide 290 mcg a day. She was supposed to be scheduled for an EGD and colonoscopy in 10/18. She has extreme fatigue since her cholecystectomy(02/09/17) and has been gaining weight 10 lbs since. She has appendectomy in 05/23/17. She has noted that she usually has 1 Bm in 7 days, consistency of stool is like round ball/pellets. She tried linzess 290 mcg, but it caused cramping and bloating and did not work after 1 day. She has tried miralax and other OTC laxatives without much relief. She denies nausea or vomiting. SHe developed constipation for the past 15 years since hemorrhoidectomy at [facility]. She had tried fiber/metamucil, dulcolax, stool softners with minimal relief. She denies use of fast food, breakfast is eggs/bacon/wheat toast, she takes almonds, she drinks 40 oz of water in 8 hours, for supper she may eat meat and vegetables, normally breakfast is her only meal. Denies IVDA, may have acquired it sexually 4 years ago, she had blood transfusion 25 years from miscarriage in [place], she has multiple tattooes(some done son's friend at home ). Never been hepatitis C. Denies jaundice, ascites, encephalopathy, hematemesis. No prior EGD /colonoscopy. There is no family history of colon cancer. SHe does have heavy menstrual loss, she finishes 36 tampons in 3-4 days, she passes a lot of clots during her menstrual cycles.    Current Medications   Not-Taking   Phentermine HCl 37.5 MG Capsule 1 capsule Orally Once a day   MVI ? 1 tablet by mouth once a day   TriLyte(PEG 3350-KCl-Na Bicarb-NaCl) 420 GM Solution Reconstituted as directed Orally as directed   Hydrochlorothiazide 12.5 MG Tablet 1 tablet in the morning Orally Once a day   Medication List reviewed and reconciled with the patient    Past Medical History  Asthma.   HTN.    Surgical History  cholecystectomy   hemorrhoidectomy   breast implant   face reconstruction due to almost being kill   appendectomy    Family History  Father: alive  Mother: alive, diagnosed with Diabetes, Hypertension  Maternal Grand Mother: Diabetes  No Family History of Colon Cancer, Polyps, or Liver Disease.   Social History  General:  Tobacco use  cigarettes: Never smoked Tobacco history last updated 02/23/2017 no Alcohol.  no Recreational drug use.  OCCUPATION: Customer service rep.    Allergies  N.K.D.A.          Hospitalization/Major Diagnostic Procedure  Cholecystectomy 02/07/17  appendectomy 05/2017   Review of Systems  CONSTITUTIONAL:  Chills No. Fatigue YES. Fever No. Insomnia No. Night sweats YES. Weight change YES.  HEENT:  Change in vision No. Double vision No . Hoarseness No. Nose bleeds No. sore throat No. Sinus Problems No. Glaucoma No.  CARDIOLOGY:  ByPass Surgery No. Poor Circulation No. Artificial Heart Valves No. High blood pressure No. History of Heart attack No. Irregular heart beat No. Known coronary artery disease No. Pacemaker/Defibrillator No.  RESPIRATORY:  Shortness of breath No. Cough No. Excessive Sputum No. Using Oxygen No. Asthma YES. Bronchitis No. Pneumonia  No. Sleep Apnea No.  UROLOGY:  Interstitials Cystitis No. Incontinence No. Blood in urine No. Difficulty urinating No. Kidney disease No. Kidney stones No.  GASTROENTEROLOGY:  Abdominal pain YES. Acid reflux No. Black stools No. Bloating/belching No. Change in bowel habits No.  Constipation YES. Dark tarry stools No. Diarrhea No. Difficulty swallowing No. Heartburn No. Incontinence of stool YES. Indigestion: No. Lactose intolerance YES. Nausea No. Rectal bleeding No. Vomiting No. Hepatitis/yellow jaundice No. History of Ulcers No. Use of Pain Medication No. Previous Colonoscopy No.  MUSCULOSKELETAL:  joint pain No. Arthritis No. Joint Replacement No.  NEUROLOGY:  Dizziness No. Fainting No. Headache No. Paralysis No. Seizures No. Strokes No. Weakness No. Alzheimer's No.  SKIN:  Rash No. Bruises easily YES.  ENDOCRINOLOGY:  Diabetes No. High cholesterol No. Thyroid disorder No.  HEMATOLOGY/LYMPH:  Abnormal bleeding No. Anemia YES. Enlarged lymph nodes No. Past blood transfusion No. Swollen glands No. Blood Clots No. Using Blood Thinners No.  INFECTIOUS DISEASE:  HIV/AIDS No. Tuberculosis No . Hepatitis YES. Sexually Transmitted Disease No. MRSA No.  GI PROCEDURE:  no Pacemaker/ AICD, no. no Artificial heart valves. no MI/heart attack. no Abnormal heart rhythm. no Angina. no CVA. Hypertension YES. no Hypotension. Asthma, COPD YES, Asthma. no Sleep apnea. no Seizure disorders. Artificial joints YES. no Severe DJD. no Diabetes. no Significant headaches. no Vertigo. Depression/anxiety YES, Both. no Abnormal bleeding. no Kidney Disease. no Liver disease, no. no Chance of pregnancy. no Blood transfusion. no Method of Birth Control. no Birth control pills.  GU/GYN:  Pregnant Now No. Trying to conceive No. Use Birth Control No. Heavy Periods No.         Vital Signs  Wt 207.7, Wt change 5.6 lb, Ht 65, BMI 34.56, Temp 98.3, Pulse sitting 78, BP sitting 147/87.   Examination  Gastroenterology:: GENERAL APPEARANCE: Well developed, overweight, no active distress, pleasant, no acute distress.  EYES: Lids and conjunctiva normal. Sclera normal, pupils equal and reactive .  ORAL CAVITY: Lips, teeth and gums are normal. Pharynx, tongue, mucosa normal .  SCLERA: anicteric .   NECK Full ROM, trachea midline, no thyromegaly or masses .  CARDIOVASCULAR PMI LS border. Normal RRR w/o murmers or gallops. No peripheral edema .  RESPIRATORY Breath sounds normal. Respiration even and unlabored .  ABDOMEN No masses palpated. Liver and spleen not palpated, normal. Bowel sounds normal, Abdomen not distended .  EXTREMITIES: No edema, pulses intact .  NEURO: normal strength and reflexes, cranial nerves II-XII grossly intact, normal gait .  PSYCH: mood/affect normal .     Assessments   1. Chronic hepatitis C without hepatic coma - B18.2 (Primary)   2. Iron deficiency anemia due to chronic blood loss - D50.0   3. Constipation, unspecified constipation type - K59.00   Treatment  1. Chronic hepatitis C without hepatic coma  Start Harvoni Tablets, 90mg /400mg , 1 tablet, By Mouth, Once a Day, 28 days, 28 Tablet, Refills 2 Notes: Treatment naive, non-cirrhotic, staged F3/F4 on ultrasound elastography. Will get approval to start Harvoni 1 pill a day for 12 weeks. Discussed about the common adverse effects such as fatigue, headache, nausea and insomnia.  During therapy CBC, serum creatinine and GFR along with hepatic panel needs to be monitored after 4 weeks of therapy, HCV viral load testing to be done after 4 weeks of therapy and at 12 weeks after completion of therapy. If hep C viral load is detected at 2 weeks, repeat testing is done 2 weeks after additional therapy at week  6.  When patient gets approved for Harvoni will need labs for CBC, CMP, HBV PCR DNA(ensure no chronic hepatitis/latent HBV as patient had HBcAb positibe,although HBsAG was negative). .    2. Iron deficiency anemia due to chronic blood loss  IMAGING: Colonoscopy    Whitfield,Dia 07/02/2017 09:07:56 AM > spoke with Vickie-scheduled for 08/24/17 at [facility] at 11:30am.  IMAGING: Esophagoscopy    Whitfield,Dia 07/02/2017 09:07:56 AM > spoke with Vickie-scheduled for 08/24/17 at [facility] at 11:30am.   Notes: Discussed  with patient that she will have worsening of constipation with iron supplements. As her Hb is above 11, I do not recommend IV iron therapy at this time. Will perform a diagnostic EGD and possibel SB biopsies. Also, if she is found to have varices durign EGD that needs banding, I have discussed with the patient regarding associated risks and benefits. Advised patient to take metamucil 1 tablespoon BID for 10 days before scheduled colonoscopy. Advised patient to increase intake of fruits, vegetables and whole grains and take atleast 60 oz of water a day.  Referral To: Reason:egd w/possible banding-colon-propofol-spoke with Vickie-[facility]-#[id number]    3. Constipation, unspecified constipation type  Start Amitiza Capsule, 24 MCG, 1 capsule with food, Orally, Twice a day, 90 days, 180 Capsule, Refills 1 Start Magnesium Capsule, 500 MG, 2 capsules, Orally, Once a day, 90 days, 180 Capsule, Refills 3 Notes:  Advised patient to start amitiza 24 mcg BID and take magnesium pills 500 mg X 2 at night. Ronnette Juniper, MD

## 2017-08-24 NOTE — Discharge Instructions (Signed)

## 2017-08-24 NOTE — Op Note (Signed)
[facility] Patient Name: Nancy Mckinney Procedure Date: 08/24/2017 MRN: [id number] Attending MD: Ronnette Juniper , MD Date of Birth: 05/10/1971 CSN: [id number] Age: 47 Admit Type: Outpatient Procedure:                Colonoscopy Indications:              This is the patient's first colonoscopy,                            Unexplained iron deficiency anemia Providers:                Ronnette Juniper, MD, Cleda Daub, RN, William Dalton,                            Technician, Charolette Child, Technician Referring MD:              Medicines:                Monitored Anesthesia Care Complications:            No immediate complications. Estimated Blood Loss:     Estimated blood loss: none. Procedure:                Pre-Anesthesia Assessment:                           - Prior to the procedure, a History and Physical                            was performed, and patient medications and                            allergies were reviewed. The patient's tolerance of                            previous anesthesia was also reviewed. The risks                            and benefits of the procedure and the sedation                            options and risks were discussed with the patient.                            All questions were answered, and informed consent                            was obtained. Prior Anticoagulants: The patient has                            taken no previous anticoagulant or antiplatelet                            agents. ASA Grade Assessment: III - A patient with  severe systemic disease. After reviewing the risks                            and benefits, the patient was deemed in                            satisfactory condition to undergo the procedure.                           - Prior to the procedure, a History and Physical                            was performed, and patient medications and                            allergies  were reviewed. The patient's tolerance of                            previous anesthesia was also reviewed. The risks                            and benefits of the procedure and the sedation                            options and risks were discussed with the patient.                            All questions were answered, and informed consent                            was obtained. Prior Anticoagulants: The patient has                            taken no previous anticoagulant or antiplatelet                            agents. ASA Grade Assessment: III - A patient with                            severe systemic disease. After reviewing the risks                            and benefits, the patient was deemed in                            satisfactory condition to undergo the procedure.                           After obtaining informed consent, the colonoscope                            was passed under direct vision. Throughout the  procedure, the patient's blood pressure, pulse, and                            oxygen saturations were monitored continuously. The                            [id number] ([id number]) scope was introduced through                            the anus and advanced to the the terminal ileum.                            The colonoscopy was performed without difficulty.                            The patient tolerated the procedure well. The                            quality of the bowel preparation was adequate to                            identify polyps 6 mm and larger in size. The                            terminal ileum, ileocecal valve, appendiceal                            orifice, and rectum were photographed. Scope In: 12:26:15 PM Scope Out: 45:40:98 PM Scope Withdrawal Time: 0 hours 14 minutes 16 seconds  Total Procedure Duration: 0 hours 22 minutes 27 seconds  Findings:      The perianal and digital rectal examinations were  normal.      The terminal ileum appeared normal.      Extensive amounts of liquid semi-liquid stool was found in the entire       colon, making visualization difficult. Lavage of the area was performed       using a large amount, resulting in clearance with fair visualization.      A 4 mm polyp was found in the descending colon. The polyp was sessile.       The polyp was removed with a cold biopsy forceps. Resection and       retrieval were complete.      The retroflexed view of the distal rectum and anal verge was normal and       showed no anal or rectal abnormalities.      The exam was otherwise without abnormality. Impression:               - The examined portion of the ileum was normal.                           - Stool in the entire examined colon.                           - One 4 mm polyp in the descending colon, removed  with a cold biopsy forceps. Resected and retrieved.                           - The distal rectum and anal verge are normal on                            retroflexion view.                           - The examination was otherwise normal. Moderate Sedation:      Patient did not receive moderate sedation for this procedure, but       instead received monitored anesthesia care. Recommendation:           - Patient has a contact number available for                            emergencies. The signs and symptoms of potential                            delayed complications were discussed with the                            patient. Return to normal activities tomorrow.                            Written discharge instructions were provided to the                            patient.                           - Resume regular diet.                           - Continue present medications.                           - Await pathology results.                           - Repeat colonoscopy in 3 years for                             surveillance/fair prep. Procedure Code(s):        --- Professional ---                           [id number], Colonoscopy, flexible; with biopsy, single                            or multiple Diagnosis Code(s):        --- Professional ---                           D12.4, Benign neoplasm of descending colon  D50.9, Iron deficiency anemia, unspecified CPT copyright 2016 American Medical Association. All rights reserved. The codes documented in this report are preliminary and upon coder review may  be revised to meet current compliance requirements. Ronnette Juniper, MD 08/24/2017 1:00:33 PM This report has been signed electronically. Number of Addenda: 0

## 2017-08-24 NOTE — Anesthesia Preprocedure Evaluation (Signed)
Anesthesia Evaluation  Patient identified by MRN, date of birth, ID band Patient awake    Reviewed: Allergy & Precautions, NPO status , Patient's Chart, lab work & pertinent test results  Airway Mallampati: II  TM Distance: >3 FB Neck ROM: Full    Dental no notable dental hx.    Pulmonary asthma ,    Pulmonary exam normal breath sounds clear to auscultation       Cardiovascular hypertension, Pt. on medications Normal cardiovascular exam Rhythm:Regular Rate:Normal     Neuro/Psych negative neurological ROS  negative psych ROS   GI/Hepatic negative GI ROS, (+) Cirrhosis       , Hepatitis -, C  Endo/Other  negative endocrine ROS  Renal/GU negative Renal ROS  negative genitourinary   Musculoskeletal negative musculoskeletal ROS (+)   Abdominal   Peds negative pediatric ROS (+)  Hematology negative hematology ROS (+)   Anesthesia Other Findings   Reproductive/Obstetrics negative OB ROS                             Anesthesia Physical Anesthesia Plan  ASA: III  Anesthesia Plan: MAC   Post-op Pain Management:    Induction: Intravenous  PONV Risk Score and Plan: 2 and Treatment may vary due to age or medical condition  Airway Management Planned: Nasal Cannula  Additional Equipment:   Intra-op Plan:   Post-operative Plan: Extubation in OR  Informed Consent: I have reviewed the patients History and Physical, chart, labs and discussed the procedure including the risks, benefits and alternatives for the proposed anesthesia with the patient or authorized representative who has indicated his/her understanding and acceptance.   Dental advisory given  Plan Discussed with: CRNA  Anesthesia Plan Comments:         Anesthesia Quick Evaluation

## 2017-08-24 NOTE — Op Note (Signed)
[facility] Patient Name: Nancy Mckinney Procedure Date: 08/24/2017 MRN: [id number] Attending MD: Ronnette Juniper , MD Date of Birth: 1971/03/25 CSN: [id number] Age: 47 Admit Type: Outpatient Procedure:                Upper GI endoscopy Indications:              Unexplained iron deficiency anemia, Hepatitis rule                            out esophageal varices Providers:                Ronnette Juniper, MD, Cleda Daub, RN, William Dalton,                            Technician, Charolette Child, Technician Referring MD:              Medicines:                Monitored Anesthesia Care Complications:            No immediate complications. Estimated Blood Loss:     Estimated blood loss: none. Procedure:                Pre-Anesthesia Assessment:                           - Prior to the procedure, a History and Physical                            was performed, and patient medications and                            allergies were reviewed. The patient's tolerance of                            previous anesthesia was also reviewed. The risks                            and benefits of the procedure and the sedation                            options and risks were discussed with the patient.                            All questions were answered, and informed consent                            was obtained. Prior Anticoagulants: The patient has                            taken no previous anticoagulant or antiplatelet                            agents. ASA Grade Assessment: III - A patient with  severe systemic disease. After reviewing the risks                            and benefits, the patient was deemed in                            satisfactory condition to undergo the procedure.                           After obtaining informed consent, the endoscope was                            passed under direct vision. Throughout the   procedure, the patient's blood pressure, pulse, and                            oxygen saturations were monitored continuously. The                            [id number] ([id number]) scope was introduced through the                            mouth, and advanced to the second part of duodenum.                            The upper GI endoscopy was accomplished without                            difficulty. The patient tolerated the procedure                            well. Scope In: Scope Out: Findings:      The examined esophagus was normal.      The Z-line was regular and was found 38 cm from the incisors.      Localized mildly erythematous mucosa without bleeding was found in the       gastric antrum. Biopsies were taken with a cold forceps for Helicobacter       pylori testing.      The cardia and gastric fundus were normal on retroflexion.      The examined duodenum was normal. Biopsies for histology were taken with       a cold forceps for evaluation of celiac disease. Impression:               - Normal esophagus.                           - Z-line regular, 38 cm from the incisors.                           - Erythematous mucosa in the antrum. Biopsied.                           - Normal examined duodenum. Biopsied. Moderate Sedation:      Patient did not receive moderate sedation for this procedure, but  instead received monitored anesthesia care. Recommendation:           - Patient has a contact number available for                            emergencies. The signs and symptoms of potential                            delayed complications were discussed with the                            patient. Return to normal activities tomorrow.                            Written discharge instructions were provided to the                            patient.                           - Resume regular diet.                           - Continue present medications.                            - Await pathology results.                           - Patient has a contact number available for                            emergencies. The signs and symptoms of potential                            delayed complications were discussed with the                            patient. Return to normal activities tomorrow.                            Written discharge instructions were provided to the                            patient. Procedure Code(s):        --- Professional ---                           [phone], Esophagogastroduodenoscopy, flexible,                            transoral; with biopsy, single or multiple Diagnosis Code(s):        --- Professional ---                           K31.89, Other diseases of stomach and duodenum  D50.9, Iron deficiency anemia, unspecified                           K75.9, Inflammatory liver disease, unspecified CPT copyright 2016 American Medical Association. All rights reserved. The codes documented in this report are preliminary and upon coder review may  be revised to meet current compliance requirements. Ronnette Juniper, MD 08/24/2017 12:57:08 PM This report has been signed electronically. Number of Addenda: 0

## 2017-08-24 NOTE — Anesthesia Postprocedure Evaluation (Signed)
Anesthesia Post Note  Patient: [name]  Procedure(s) Performed: ESOPHAGOGASTRODUODENOSCOPY (EGD) W/ POSSIBLE BANDING (N/A ) COLONOSCOPY (N/A )     Patient location during evaluation: PACU Anesthesia Type: MAC Level of consciousness: awake and alert Pain management: pain level controlled Vital Signs Assessment: post-procedure vital signs reviewed and stable Respiratory status: spontaneous breathing, nonlabored ventilation, respiratory function stable and patient connected to nasal cannula oxygen Cardiovascular status: stable and blood pressure returned to baseline Postop Assessment: no apparent nausea or vomiting Anesthetic complications: no    Last Vitals:  Vitals:   08/24/17 1310 08/24/17 1320  BP: 120/63   Pulse: 70 75  Resp: (!) 21 19  Temp:    SpO2: 100% 96%    Last Pain:  Vitals:   08/24/17 1256  TempSrc: Oral                 Montez Hageman

## 2017-08-24 NOTE — Transfer of Care (Signed)
Immediate Anesthesia Transfer of Care Note  Patient: Nancy Mckinney  Procedure(s) Performed: ESOPHAGOGASTRODUODENOSCOPY (EGD) W/ POSSIBLE BANDING (N/A ) COLONOSCOPY (N/A )  Patient Location: Endoscopy Unit  Anesthesia Type:MAC  Level of Consciousness: awake  Airway & Oxygen Therapy: Patient Spontanous Breathing and Patient connected to nasal cannula oxygen  Post-op Assessment: Report given to RN and Post -op Vital signs reviewed and stable  Post vital signs: Reviewed and stable  Last Vitals:  Vitals:   08/24/17 1030  BP: 132/88  Pulse: 75  Resp: 19  Temp: 36.4 C  SpO2: 100%    Last Pain:  Vitals:   08/24/17 1030  TempSrc: Oral         Complications: No apparent anesthesia complications

## 2017-08-24 NOTE — Anesthesia Procedure Notes (Signed)
Performed by: Bandy Honaker L, CRNA Oxygen Delivery Method: Nasal cannula       

## 2017-08-24 NOTE — Op Note (Signed)
EGD and colonoscopy was done for unexplained iron deficiency anemia, hepatitis C rule out esophageal varices..  Findings: The entire esophagus appeared unremarkable, no evidence of varices, Z line regular at 38 cm. Mild erythema noted in the antrum, biopsies taken for H. Pylori. Duodenal bulb and rest of the duodenum appeared unremarkable, biopsies taken for celiac disease. Retroflexion revealed a normal cardia and fundus.   Liquid and semiliquid stool was noted throughout the colon making visualization difficult. After lavage with large amount of water,fair visualization was obtained.  Terminal ileum appeared unremarkable. A small polyp was noted in descending colon removed by biopsy polypectomy.  Retroflexion was unremarkable.   Recommendation: Await pathology as an outpatient. Resume regular diet. Repeat colonoscopy in 3 years.  Ronnette Juniper, M.D.

## 2017-08-24 NOTE — Brief Op Note (Signed)
08/24/2017  1:02 PM  PATIENT:  Nancy Mckinney  47 y.o. female  PRE-OPERATIVE DIAGNOSIS:  Iron deficiency anemia due to chronic blood loss  POST-OPERATIVE DIAGNOSIS:  colon polyp /normal egd  PROCEDURE:  Procedure(s): ESOPHAGOGASTRODUODENOSCOPY (EGD) W/ POSSIBLE BANDING (N/A) COLONOSCOPY (N/A)  SURGEON:  Surgeon(s) and Role:    Ronnette Juniper, MD - Primary  PHYSICIAN ASSISTANT:None   ASSISTANTS: Cleda Daub, RN, William Dalton, Tech, Charolette Child, Tech  ANESTHESIA:  MAC  EBL:  None   BLOOD ADMINISTERED:none  DRAINS: none   LOCAL MEDICATIONS USED:  NONE  SPECIMEN:  Biopsy / Limited Resection  DISPOSITION OF SPECIMEN:  PATHOLOGY  COUNTS:  YES  TOURNIQUET:  * No tourniquets in log *  DICTATION: .Dragon Dictation  PLAN OF CARE: Admit to inpatient   PATIENT DISPOSITION:  PACU - hemodynamically stable.   Delay start of Pharmacological VTE agent (>24hrs) due to surgical blood loss or risk of bleeding: no

## 2017-08-24 NOTE — Interval H&P Note (Signed)
History and Physical Interval Note: 46/female with IDA, HCV on treatment,for EGD for possible banding and colonoscopy.  08/24/2017 12:07 PM  Nancy Mckinney  has presented today for EGD with possible banding and colonoscopy, with the diagnosis of Iron deficiency anemia due to chronic blood loss  The various methods of treatment have been discussed with the patient and family. After consideration of risks, benefits and other options for treatment, the patient has consented to  Procedure(s): ESOPHAGOGASTRODUODENOSCOPY (EGD) W/ POSSIBLE BANDING (N/A) COLONOSCOPY (N/A) as a surgical intervention .  The patient's history has been reviewed, patient examined, no change in status, stable for surgery.  I have reviewed the patient's chart and labs.  Questions were answered to the patient's satisfaction.     Ronnette Juniper

## 2017-08-25 ENCOUNTER — Encounter (HOSPITAL_COMMUNITY): Payer: Self-pay | Admitting: Gastroenterology

## 2017-09-30 ENCOUNTER — Other Ambulatory Visit: Payer: Self-pay | Admitting: Obstetrics & Gynecology

## 2017-09-30 DIAGNOSIS — Z1231 Encounter for screening mammogram for malignant neoplasm of breast: Secondary | ICD-10-CM

## 2017-10-22 ENCOUNTER — Other Ambulatory Visit: Payer: Self-pay | Admitting: Obstetrics and Gynecology

## 2017-10-25 ENCOUNTER — Ambulatory Visit
Admission: RE | Admit: 2017-10-25 | Discharge: 2017-10-25 | Disposition: A | Discharge status: Home / Self Care | Payer: Managed Care, Other (non HMO) | Source: Ambulatory Visit | Attending: Obstetrics & Gynecology | Admitting: Obstetrics & Gynecology

## 2017-10-25 DIAGNOSIS — Z1231 Encounter for screening mammogram for malignant neoplasm of breast: Secondary | ICD-10-CM

## 2017-11-10 DIAGNOSIS — B1920 Unspecified viral hepatitis C without hepatic coma: Secondary | ICD-10-CM

## 2017-11-10 HISTORY — DX: Unspecified viral hepatitis C without hepatic coma: B19.20

## 2017-12-24 NOTE — Patient Instructions (Addendum)
Your procedure is scheduled on: Wednesday January 05, 2018 at 7:30 am  Enter through the Main Entrance of [facility] at: 6:00 am  Pick up the phone at the desk and dial [phone].  Call this number if you have problems the morning of surgery: [phone].  Remember: Do NOT eat food or drink any liquids after: Midnight on Tuesday June 25 Do NOT drink clear liquids after: Take these medicines the morning of surgery with a SIP OF WATER: Amitza. May take Lysteda, albuterol inhaler if needed  BRING ALBUTEROL INHALER WITH YOU DAY OF SURGERY   STOP ALL VITAMINS, SUPPLEMENTS, HERBAL MEDICATIONS NOW  DO NOT SMOKE DAY OF SURGERY  Do NOT wear jewelry (body piercing), metal hair clips/bobby pins, make-up, or nail polish. Do NOT wear lotions, powders, or perfumes.  You may wear deoderant. Do NOT shave for 48 hours prior to surgery. Do NOT bring valuables to the hospital. Contacts, dentures, or bridgework may not be worn into surgery.  Have a responsible adult drive you home and stay with you for 24 hours after your procedure

## 2017-12-29 ENCOUNTER — Encounter (HOSPITAL_COMMUNITY)
Admission: RE | Admit: 2017-12-29 | Discharge: 2017-12-29 | Disposition: A | Discharge status: Home / Self Care | Payer: Managed Care, Other (non HMO) | Source: Ambulatory Visit | Attending: Obstetrics & Gynecology | Admitting: Obstetrics & Gynecology

## 2017-12-29 ENCOUNTER — Encounter (HOSPITAL_COMMUNITY): Payer: Self-pay

## 2017-12-29 ENCOUNTER — Other Ambulatory Visit: Payer: Self-pay

## 2017-12-29 DIAGNOSIS — Z01812 Encounter for preprocedural laboratory examination: Secondary | ICD-10-CM | POA: Insufficient documentation

## 2017-12-29 HISTORY — DX: Anemia, unspecified: D64.9

## 2017-12-29 LAB — COMPREHENSIVE METABOLIC PANEL
ALBUMIN: 4.3 g/dL (ref 3.5–5.0)
ALT: 28 U/L (ref 14–54)
AST: 47 U/L — ABNORMAL HIGH (ref 15–41)
Alkaline Phosphatase: 79 U/L (ref 38–126)
Anion gap: 10 (ref 5–15)
BUN: 15 mg/dL (ref 6–20)
CHLORIDE: 105 mmol/L (ref 101–111)
CO2: 23 mmol/L (ref 22–32)
CREATININE: 0.92 mg/dL (ref 0.44–1.00)
Calcium: 9.2 mg/dL (ref 8.9–10.3)
GFR calc non Af Amer: >60 mL/min (ref >60)
GLUCOSE: 103 mg/dL — AB (ref 65–99)
Potassium: 3.4 mmol/L — ABNORMAL LOW (ref 3.5–5.1)
SODIUM: 138 mmol/L (ref 135–145)
Total Bilirubin: 0.9 mg/dL (ref 0.3–1.2)
Total Protein: 8.9 g/dL — ABNORMAL HIGH (ref 6.5–8.1)

## 2017-12-29 LAB — CBC
HCT: 37 % (ref 36.0–46.0)
Hemoglobin: 11.7 g/dL — ABNORMAL LOW (ref 12.0–15.0)
MCH: 24.4 pg — AB (ref 26.0–34.0)
MCHC: 31.6 g/dL (ref 30.0–36.0)
MCV: 77.2 fL — ABNORMAL LOW (ref 78.0–100.0)
PLATELETS: 184 10*3/uL (ref 150–400)
RBC: 4.79 MIL/uL (ref 3.87–5.11)
RDW: 14.3 % (ref 11.5–15.5)
WBC: 4.4 10*3/uL (ref 4.0–10.5)
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
## 2018-01-05 ENCOUNTER — Ambulatory Visit (HOSPITAL_COMMUNITY): Payer: Managed Care, Other (non HMO) | Admitting: Anesthesiology

## 2018-01-05 ENCOUNTER — Other Ambulatory Visit: Payer: Self-pay

## 2018-01-05 ENCOUNTER — Encounter (HOSPITAL_COMMUNITY)
Admission: AD | Disposition: A | Discharge status: Home / Self Care | Payer: Self-pay | Source: Ambulatory Visit | Attending: Obstetrics & Gynecology

## 2018-01-05 ENCOUNTER — Encounter (HOSPITAL_COMMUNITY): Payer: Self-pay | Admitting: *Deleted

## 2018-01-05 ENCOUNTER — Ambulatory Visit (HOSPITAL_COMMUNITY)
Admission: AD | Admit: 2018-01-05 | Discharge: 2018-01-05 | Disposition: A | Discharge status: Home / Self Care | Payer: Managed Care, Other (non HMO) | Source: Ambulatory Visit | Attending: Obstetrics & Gynecology | Admitting: Obstetrics & Gynecology

## 2018-01-05 DIAGNOSIS — N840 Polyp of corpus uteri: Secondary | ICD-10-CM | POA: Insufficient documentation

## 2018-01-05 DIAGNOSIS — J45909 Unspecified asthma, uncomplicated: Secondary | ICD-10-CM | POA: Diagnosis not present

## 2018-01-05 DIAGNOSIS — D649 Anemia, unspecified: Secondary | ICD-10-CM | POA: Diagnosis not present

## 2018-01-05 DIAGNOSIS — B1920 Unspecified viral hepatitis C without hepatic coma: Secondary | ICD-10-CM | POA: Diagnosis not present

## 2018-01-05 DIAGNOSIS — Z79899 Other long term (current) drug therapy: Secondary | ICD-10-CM | POA: Insufficient documentation

## 2018-01-05 DIAGNOSIS — I10 Essential (primary) hypertension: Secondary | ICD-10-CM | POA: Diagnosis not present

## 2018-01-05 DIAGNOSIS — N939 Abnormal uterine and vaginal bleeding, unspecified: Secondary | ICD-10-CM | POA: Diagnosis present

## 2018-01-05 HISTORY — PX: HYSTEROSCOPY W/D&C: SHX1775

## 2018-01-05 SURGERY — DILATATION AND CURETTAGE /HYSTEROSCOPY
Anesthesia: General | Site: Vagina

## 2018-01-05 MED ORDER — PROPOFOL 10 MG/ML IV BOLUS
INTRAVENOUS | Status: AC
  Filled 2018-01-05: qty 20

## 2018-01-05 MED ORDER — LIDOCAINE-EPINEPHRINE 1 %-1:100000 IJ SOLN
INTRAMUSCULAR | Status: DC | PRN
  Administered 2018-01-05: 20 mL

## 2018-01-05 MED ORDER — LACTATED RINGERS IV SOLN: INTRAVENOUS | Status: DC

## 2018-01-05 MED ORDER — IBUPROFEN 100 MG/5ML PO SUSP
200.0000 mg | Freq: Four times a day (QID) | ORAL | Status: DC | PRN
  Filled 2018-01-05: qty 20

## 2018-01-05 MED ORDER — SODIUM CHLORIDE 0.9 % IR SOLN
Status: DC | PRN
  Administered 2018-01-05: 3000 mL

## 2018-01-05 MED ORDER — LACTATED RINGERS IV SOLN
INTRAVENOUS | Status: DC
  Administered 2018-01-05 (×2): via INTRAVENOUS

## 2018-01-05 MED ORDER — IBUPROFEN 200 MG PO TABS
200.0000 mg | ORAL_TABLET | Freq: Four times a day (QID) | ORAL | Status: DC | PRN
  Filled 2018-01-05: qty 2

## 2018-01-05 MED ORDER — LIDOCAINE HCL (CARDIAC) PF 100 MG/5ML IV SOSY
PREFILLED_SYRINGE | INTRAVENOUS | Status: DC | PRN
  Administered 2018-01-05: 100 mg via INTRAVENOUS

## 2018-01-05 MED ORDER — ALBUTEROL SULFATE HFA 108 (90 BASE) MCG/ACT IN AERS
INHALATION_SPRAY | RESPIRATORY_TRACT | Status: AC
  Filled 2018-01-05: qty 6.7

## 2018-01-05 MED ORDER — MIDAZOLAM HCL 2 MG/2ML IJ SOLN
INTRAMUSCULAR | Status: DC | PRN
  Administered 2018-01-05: 2 mg via INTRAVENOUS

## 2018-01-05 MED ORDER — PROPOFOL 10 MG/ML IV BOLUS
INTRAVENOUS | Status: DC | PRN
  Administered 2018-01-05: 100 mg via INTRAVENOUS

## 2018-01-05 MED ORDER — KETOROLAC TROMETHAMINE 30 MG/ML IJ SOLN: 30.0000 mg | Freq: Once | INTRAMUSCULAR | Status: DC | PRN

## 2018-01-05 MED ORDER — FENTANYL CITRATE (PF) 100 MCG/2ML IJ SOLN
INTRAMUSCULAR | Status: DC | PRN
  Administered 2018-01-05 (×2): 50 ug via INTRAVENOUS

## 2018-01-05 MED ORDER — ONDANSETRON HCL 4 MG/2ML IJ SOLN
INTRAMUSCULAR | Status: DC | PRN
  Administered 2018-01-05: 4 mg via INTRAVENOUS

## 2018-01-05 MED ORDER — LIDOCAINE HCL (CARDIAC) PF 100 MG/5ML IV SOSY
PREFILLED_SYRINGE | INTRAVENOUS | Status: AC
  Filled 2018-01-05: qty 5

## 2018-01-05 MED ORDER — ONDANSETRON HCL 4 MG/2ML IJ SOLN: 4.0000 mg | Freq: Once | INTRAMUSCULAR | Status: DC | PRN

## 2018-01-05 MED ORDER — ALBUTEROL SULFATE HFA 108 (90 BASE) MCG/ACT IN AERS
INHALATION_SPRAY | RESPIRATORY_TRACT | Status: DC | PRN
  Administered 2018-01-05: 4 via RESPIRATORY_TRACT

## 2018-01-05 MED ORDER — MEPERIDINE HCL 25 MG/ML IJ SOLN: 6.2500 mg | INTRAMUSCULAR | Status: DC | PRN

## 2018-01-05 MED ORDER — SCOPOLAMINE 1 MG/3DAYS TD PT72
1.0000 | MEDICATED_PATCH | Freq: Once | TRANSDERMAL | Status: DC
  Administered 2018-01-05: 1.5 mg via TRANSDERMAL

## 2018-01-05 MED ORDER — FENTANYL CITRATE (PF) 250 MCG/5ML IJ SOLN
INTRAMUSCULAR | Status: AC
Start: 2018-01-05 — End: ?
  Filled 2018-01-05: qty 5

## 2018-01-05 MED ORDER — FENTANYL CITRATE (PF) 100 MCG/2ML IJ SOLN
25.0000 ug | INTRAMUSCULAR | Status: DC | PRN
  Administered 2018-01-05: 50 ug via INTRAVENOUS

## 2018-01-05 MED ORDER — SCOPOLAMINE 1 MG/3DAYS TD PT72
MEDICATED_PATCH | TRANSDERMAL | Status: AC
  Administered 2018-01-05: 1.5 mg via TRANSDERMAL
  Filled 2018-01-05: qty 1

## 2018-01-05 MED ORDER — MIDAZOLAM HCL 2 MG/2ML IJ SOLN
INTRAMUSCULAR | Status: AC
  Filled 2018-01-05: qty 2

## 2018-01-05 MED ORDER — ATROPINE SULFATE 0.4 MG/ML IJ SOLN
INTRAMUSCULAR | Status: AC
  Filled 2018-01-05: qty 1

## 2018-01-05 MED ORDER — SILVER NITRATE-POT NITRATE 75-25 % EX MISC
CUTANEOUS | Status: DC | PRN
  Administered 2018-01-05: 1

## 2018-01-05 MED ORDER — FENTANYL CITRATE (PF) 100 MCG/2ML IJ SOLN
INTRAMUSCULAR | Status: AC
  Filled 2018-01-05: qty 2

## 2018-01-05 MED ORDER — DEXAMETHASONE SODIUM PHOSPHATE 10 MG/ML IJ SOLN
INTRAMUSCULAR | Status: DC | PRN
  Administered 2018-01-05: 4 mg via INTRAVENOUS

## 2018-01-05 MED ORDER — LIDOCAINE-EPINEPHRINE 1 %-1:100000 IJ SOLN
INTRAMUSCULAR | Status: AC
  Filled 2018-01-05: qty 1

## 2018-01-05 MED ORDER — OXYCODONE HCL 5 MG/5ML PO SOLN: 5.0000 mg | Freq: Once | ORAL | Status: DC | PRN

## 2018-01-05 MED ORDER — OXYCODONE HCL 5 MG PO TABS: 5.0000 mg | ORAL_TABLET | Freq: Once | ORAL | Status: DC | PRN

## 2018-01-05 MED ORDER — ONDANSETRON HCL 4 MG/2ML IJ SOLN
INTRAMUSCULAR | Status: AC
  Filled 2018-01-05: qty 2

## 2018-01-05 SURGICAL SUPPLY — 15 items
CANISTER SUCT 3000ML PPV (MISCELLANEOUS) ×3 IMPLANT
CATH ROBINSON RED A/P 16FR (CATHETERS) ×1 IMPLANT
DEVICE MYOSURE LITE (MISCELLANEOUS) ×2 IMPLANT
DEVICE MYOSURE REACH (MISCELLANEOUS) IMPLANT
DILATOR CANAL MILEX (MISCELLANEOUS) IMPLANT
GLOVE BIOGEL PI IND STRL 7.0 (GLOVE) ×2 IMPLANT
GLOVE BIOGEL PI INDICATOR 7.0 (GLOVE) ×4
GLOVE ECLIPSE 6.5 STRL STRAW (GLOVE) ×3 IMPLANT
GOWN STRL REUS W/TWL LRG LVL3 (GOWN DISPOSABLE) ×6 IMPLANT
PACK VAGINAL MINOR WOMEN LF (CUSTOM PROCEDURE TRAY) ×3 IMPLANT
PAD OB MATERNITY 4.3X12.25 (PERSONAL CARE ITEMS) ×3 IMPLANT
SEAL ROD LENS SCOPE MYOSURE (ABLATOR) ×3 IMPLANT
TOWEL OR 17X24 6PK STRL BLUE (TOWEL DISPOSABLE) ×6 IMPLANT
TUBING AQUILEX INFLOW (TUBING) ×3 IMPLANT
TUBING AQUILEX OUTFLOW (TUBING) ×3 IMPLANT

## 2018-01-05 NOTE — Interval H&P Note (Signed)
History and Physical Interval Note:  01/05/2018 6:54 AM  [name]  has presented today for surgery, with the diagnosis of N93.9 abnormal uterine bleeding  The various methods of treatment have been discussed with the patient and family. After consideration of risks, benefits and other options for treatment, the patient has consented to  Procedure(s) with comments: DILATATION AND CURETTAGE /HYSTEROSCOPY (N/A) - possible myosure with polypectomy as a surgical intervention .  The patient's history has been reviewed, patient examined, no change in status, stable for surgery.  I have reviewed the patient's chart and labs.  Questions were answered to the patient's satisfaction.     Nancy Mckinney

## 2018-01-05 NOTE — Anesthesia Procedure Notes (Addendum)
Procedure Name: LMA Insertion Date/Time: 01/05/2018 7:29 AM Performed by: Elenore Paddy, CRNA Pre-anesthesia Checklist: Patient identified, Emergency Drugs available, Suction available, Patient being monitored and Timeout performed Patient Re-evaluated:Patient Re-evaluated prior to induction Oxygen Delivery Method: Simple face mask Preoxygenation: Pre-oxygenation with 100% oxygen Induction Type: IV induction LMA: LMA inserted LMA Size: 4.0 Number of attempts: 1 Placement Confirmation: positive ETCO2 and breath sounds checked- equal and bilateral Dental Injury: Teeth and Oropharynx as per pre-operative assessment

## 2018-01-05 NOTE — Transfer of Care (Signed)
Immediate Anesthesia Transfer of Care Note  Patient: Nancy Mckinney  Procedure(s) Performed: DILATATION AND CURETTAGE /HYSTEROSCOPY (N/A Vagina )  Patient Location: PACU  Anesthesia Type:General  Level of Consciousness: awake, alert  and oriented  Airway & Oxygen Therapy: Patient Spontanous Breathing and Patient connected to nasal cannula oxygen  Post-op Assessment: Report given to RN and Post -op Vital signs reviewed and stable  Post vital signs: Reviewed and stable HR 80, RR 16, SaO2 100%, BP 117/68  Last Vitals:  Vitals Value Taken Time  BP 117/68 01/05/2018  8:01 AM  Temp    Pulse 82 01/05/2018  8:06 AM  Resp 20 01/05/2018  8:06 AM  SpO2 100 % 01/05/2018  8:06 AM  Vitals shown include unvalidated device data.  Last Pain:  Vitals:   01/05/18 0623  TempSrc: Oral  PainSc: 0-No pain      Patients Stated Pain Goal: 3 (53/00/51 1021)  Complications: No apparent anesthesia complications

## 2018-01-05 NOTE — Anesthesia Preprocedure Evaluation (Signed)
Anesthesia Evaluation  Patient identified by MRN, date of birth, ID band Patient awake    Reviewed: Allergy & Precautions, NPO status , Patient's Chart, lab work & pertinent test results  Airway Mallampati: I       Dental no notable dental hx. (+) Teeth Intact   Pulmonary    Pulmonary exam normal breath sounds clear to auscultation       Cardiovascular hypertension, Normal cardiovascular exam Rhythm:Regular Rate:Normal     Neuro/Psych Depression Bipolar Disorder    GI/Hepatic   Endo/Other  negative endocrine ROS  Renal/GU   negative genitourinary   Musculoskeletal negative musculoskeletal ROS (+)   Abdominal (+) + obese,   Peds  Hematology   Anesthesia Other Findings   Reproductive/Obstetrics                             Anesthesia Physical Anesthesia Plan  ASA: II  Anesthesia Plan: General   Post-op Pain Management:    Induction: Intravenous  PONV Risk Score and Plan: 4 or greater and Ondansetron, Dexamethasone, Scopolamine patch - Pre-op and Midazolam  Airway Management Planned: LMA  Additional Equipment:   Intra-op Plan:   Post-operative Plan:   Informed Consent: I have reviewed the patients History and Physical, chart, labs and discussed the procedure including the risks, benefits and alternatives for the proposed anesthesia with the patient or authorized representative who has indicated his/her understanding and acceptance.   Dental advisory given  Plan Discussed with: CRNA and Surgeon  Anesthesia Plan Comments:         Anesthesia Quick Evaluation

## 2018-01-05 NOTE — Anesthesia Postprocedure Evaluation (Signed)
Anesthesia Post Note  Patient: Nancy Mckinney  Procedure(s) Performed: DILATATION AND CURETTAGE /HYSTEROSCOPY (N/A Vagina )     Patient location during evaluation: PACU Anesthesia Type: General Level of consciousness: awake Pain management: pain level controlled Vital Signs Assessment: post-procedure vital signs reviewed and stable Respiratory status: spontaneous breathing Cardiovascular status: stable Postop Assessment: no apparent nausea or vomiting Anesthetic complications: no    Last Vitals:  Vitals:   01/05/18 0900 01/05/18 0956  BP: 117/69 126/72  Pulse: 71 84  Resp: 19 18  Temp: 36.6 C 36.6 C  SpO2: 100% 99%    Last Pain:  Vitals:   01/05/18 0956  TempSrc:   PainSc: 1    Pain Goal: Patients Stated Pain Goal: 3 (01/05/18 0815)               [place]

## 2018-01-05 NOTE — Op Note (Signed)
Operative Report  PreOp: Abnormal uterine bleeding, uterine polyp PostOp: same Procedure:  Hysteroscopy, Dilation and Curettage, Myosure polypectomy Surgeon: Dr. Janyth Pupa Anesthesia: General, Cervical block Complications:none EBL: 81RR IVF:1000cc Discrepancy: 280cc  Findings: 11cm anteverted uterus, 1.5cm polyp and proliferative endometrium, ostia not well visualized due to thickened endometrium  Specimens: 1) endometrial curretings with polyp   Procedure: The patient was taken to the operating room where she underwent general anesthesia without difficulty. The patient was placed in a low lithotomy position using Allen stirrups. She was prepped and draped in the normal sterile fashion. A sterile speculum was inserted into the vagina. Cervical block was completed using Lidocaine with epinephrine 20cc.  A single tooth tenaculum was placed on the anterior lip of the cervix. The uterus was then sounded to 11cm. The endocervical canal was then serially dilated to 14French using Hank dilators.  The diagnostic hysteroscope was then inserted without difficulty and noted to have the findings as listed above. Visualization was achieved using NS as a distending medium. The myosure was inserted and resection was performed.  The hysteroscope was removed and sharp curettage was performed. The tissue was sent to pathology.  The hysteroscope was reinserted and no uterine perforation was seen. All instrument were removed. Hemostasis was observed at the cervical site using silver nitrate.  The patient was repositioned to the supine position. The patient tolerated the procedure without any complications and taken to recovery in stable condition.   Janyth Pupa, DO [phone] (pager) [phone] (office)

## 2018-01-05 NOTE — Discharge Instructions (Addendum)
HOME INSTRUCTIONS ° °Please note any unusual or excessive bleeding, pain, swelling. Mild dizziness or drowsiness are normal for about 24 hours after surgery. °  °Shower when comfortable ° °Restrictions: No driving for 24 hours or while taking pain medications. ° °Activity:  No heavy lifting (> 10 lbs), nothing in vagina (no tampons, douching, or intercourse) x 2 weeks; no tub baths for 2 weeks °Vaginal spotting is expected but if your bleeding is heavy, period like,  please call the office °  °Diet:  You may return to your regular diet.  Do not eat large meals.  Eat small frequent meals throughout the day.  Continue to drink a good amount of water at least 6-8 glasses of water per day, hydration is very important for the healing process. ° °Pain Management: Take Motrin and/or Tylenol as needed for pain. ° °Always take prescription pain medication with food, it may cause constipation, increase fluids and fiber and you may want to take an over-the-counter stool softener like Colace as needed up to 2x a day.   ° °Alcohol -- Avoid for 24 hours and while taking pain medications. ° °Nausea: Take sips of ginger ale or soda ° °Fever -- Call physician if temperature over 101 degrees ° °Follow up:  If you do not already have a follow up appointment scheduled, please call the office at [phone].  If you experience fever (a temperature greater than 100.4), pain unrelieved by pain medication, shortness of breath, swelling of a single leg, or any other symptoms which are concerning to you please the office immediately. ° ° ° ° ° ° ° ° ° °Post Anesthesia Home Care Instructions ° °Activity: °Get plenty of rest for the remainder of the day. A responsible individual must stay with you for 24 hours following the procedure.  °For the next 24 hours, DO NOT: °-Drive a car °-Operate machinery °-Drink alcoholic beverages °-Take any medication unless instructed by your physician °-Make any legal decisions or sign important  papers. ° °Meals: °Start with liquid foods such as gelatin or soup. Progress to regular foods as tolerated. Avoid greasy, spicy, heavy foods. If nausea and/or vomiting occur, drink only clear liquids until the nausea and/or vomiting subsides. Call your physician if vomiting continues. ° °Special Instructions/Symptoms: °Your throat may feel dry or sore from the anesthesia or the breathing tube placed in your throat during surgery. If this causes discomfort, gargle with warm salt water. The discomfort should disappear within 24 hours. ° °If you had a scopolamine patch placed behind your ear for the management of post- operative nausea and/or vomiting: ° °1. The medication in the patch is effective for 72 hours, after which it should be removed.  Wrap patch in a tissue and discard in the trash. Wash hands thoroughly with soap and water. °2. You may remove the patch earlier than 72 hours if you experience unpleasant side effects which may include dry mouth, dizziness or visual disturbances. °3. Avoid touching the patch. Wash your hands with soap and water after contact with the patch. °   ° °

## 2018-01-06 ENCOUNTER — Encounter (HOSPITAL_COMMUNITY): Payer: Self-pay | Admitting: Obstetrics & Gynecology

## 2018-05-12 DIAGNOSIS — N939 Abnormal uterine and vaginal bleeding, unspecified: Secondary | ICD-10-CM

## 2018-05-23 ENCOUNTER — Ambulatory Visit: Payer: 59 | Admitting: Psychology

## 2018-05-26 ENCOUNTER — Encounter (HOSPITAL_COMMUNITY): Payer: Self-pay

## 2018-05-26 NOTE — Patient Instructions (Addendum)
  Your procedure is scheduled on: Wednesday November 20 at 12:00 pm Enter through the Main Entrance of [facility] at:11oo am  Pick up the phone at the desk and dial [phone].  Call this number if you have problems the morning of surgery: [phone].  Remember: Do NOT eat food:afterMidnight Do NOT drink clear liquids after:600 am day of surgery Take these medicines the morning of surgery with a SIP OF WATER:amliza, albuterol inhaler, Advair inhaler  Bring albuterol inhaler day of surgery  Stop any vitamins, supplements, herbal medications now  Do not smoke day of surgery   Brush your teeth day of surgery  Do NOT wear jewelry (body piercing), metal hair clips/bobby pins, make-up, or nail polish. Do NOT wear lotions, powders, or perfumes.  You may wear deoderant. Do NOT shave for 48 hours prior to surgery. Do NOT bring valuables to the hospital. Contacts, dentures, or bridgework may not be worn into surgery.  Leave suitcase in car.  After surgery it may be brought to your room.  For patients admitted to the hospital, checkout time is 11:00 AM the day of discharge.

## 2018-05-27 ENCOUNTER — Encounter (HOSPITAL_COMMUNITY): Payer: Self-pay

## 2018-05-27 ENCOUNTER — Other Ambulatory Visit: Payer: Self-pay

## 2018-05-27 ENCOUNTER — Encounter (HOSPITAL_COMMUNITY)
Admission: RE | Admit: 2018-05-27 | Discharge: 2018-05-27 | Disposition: A | Discharge status: Home / Self Care | Payer: Managed Care, Other (non HMO) | Source: Ambulatory Visit | Attending: Obstetrics & Gynecology | Admitting: Obstetrics & Gynecology

## 2018-05-27 DIAGNOSIS — Z01812 Encounter for preprocedural laboratory examination: Secondary | ICD-10-CM | POA: Diagnosis not present

## 2018-05-27 LAB — ABO/RH: ABO/RH(D): O POS

## 2018-05-27 LAB — COMPREHENSIVE METABOLIC PANEL
ALT: 33 U/L (ref 0–44)
AST: 36 U/L (ref 15–41)
Albumin: 4.1 g/dL (ref 3.5–5.0)
Alkaline Phosphatase: 69 U/L (ref 38–126)
Anion gap: 7 (ref 5–15)
BUN: 12 mg/dL (ref 6–20)
CO2: 25 mmol/L (ref 22–32)
Calcium: 9.2 mg/dL (ref 8.9–10.3)
Chloride: 106 mmol/L (ref 98–111)
Creatinine, Ser: 0.73 mg/dL (ref 0.44–1.00)
GFR calc Af Amer: >60 mL/min (ref >60)
GFR calc non Af Amer: >60 mL/min (ref >60)
Glucose, Bld: 107 mg/dL — ABNORMAL HIGH (ref 70–99)
POTASSIUM: 3.5 mmol/L (ref 3.5–5.1)
Sodium: 138 mmol/L (ref 135–145)
TOTAL PROTEIN: 8.3 g/dL — AB (ref 6.5–8.1)
Total Bilirubin: 0.6 mg/dL (ref 0.3–1.2)

## 2018-05-27 LAB — TYPE AND SCREEN
ABO/RH(D): O POS
Antibody Screen: NEGATIVE

## 2018-05-27 LAB — CBC
HCT: 35.6 % — ABNORMAL LOW (ref 36.0–46.0)
Hemoglobin: 11.2 g/dL — ABNORMAL LOW (ref 12.0–15.0)
MCH: 24.9 pg — ABNORMAL LOW (ref 26.0–34.0)
MCHC: 31.5 g/dL (ref 30.0–36.0)
MCV: 79.1 fL — ABNORMAL LOW (ref 80.0–100.0)
NRBC: 0 % (ref 0.0–0.2)
Platelets: 136 10*3/uL — ABNORMAL LOW (ref 150–400)
RBC: 4.5 MIL/uL (ref 3.87–5.11)
RDW: 15.6 % — AB (ref 11.5–15.5)
WBC: 3 10*3/uL — ABNORMAL LOW (ref 4.0–10.5)
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
## 2018-05-31 NOTE — Anesthesia Preprocedure Evaluation (Addendum)
Anesthesia Evaluation  Patient identified by MRN, date of birth, ID band Patient awake    Reviewed: Allergy & Precautions, NPO status , Patient's Chart, lab work & pertinent test results  History of Anesthesia Complications Negative for: history of anesthetic complications  Airway Mallampati: II  TM Distance: >3 FB Neck ROM: Full    Dental no notable dental hx. (+) Teeth Intact, Dental Advisory Given   Pulmonary asthma ,    Pulmonary exam normal breath sounds clear to auscultation       Cardiovascular hypertension, Normal cardiovascular exam Rhythm:Regular Rate:Normal     Neuro/Psych PSYCHIATRIC DISORDERS Depression Bipolar Disorder negative neurological ROS     GI/Hepatic negative GI ROS, (+) Hepatitis -, C  Endo/Other  Obese, BMI 37.4  Renal/GU negative Renal ROS     Musculoskeletal negative musculoskeletal ROS (+)   Abdominal   Peds  Hematology  (+) anemia , Hgb 11.2 on 05/27/18   Anesthesia Other Findings Day of surgery medications reviewed with the patient.  Reproductive/Obstetrics                            Anesthesia Physical Anesthesia Plan  ASA: II  Anesthesia Plan: General   Post-op Pain Management:    Induction: Intravenous  PONV Risk Score and Plan: 3 and Ondansetron, Dexamethasone, Treatment may vary due to age or medical condition, Scopolamine patch - Pre-op and Midazolam  Airway Management Planned: Oral ETT  Additional Equipment:   Intra-op Plan:   Post-operative Plan: Extubation in OR  Informed Consent: I have reviewed the patients History and Physical, chart, labs and discussed the procedure including the risks, benefits and alternatives for the proposed anesthesia with the patient or authorized representative who has indicated his/her understanding and acceptance.   Dental advisory given  Plan Discussed with: CRNA  Anesthesia Plan Comments:         Anesthesia Quick Evaluation

## 2018-06-01 ENCOUNTER — Other Ambulatory Visit: Payer: Self-pay

## 2018-06-01 ENCOUNTER — Observation Stay (HOSPITAL_COMMUNITY)
Admission: RE | Admit: 2018-06-01 | Discharge: 2018-06-02 | Disposition: A | Discharge status: Home / Self Care | Payer: Managed Care, Other (non HMO) | Source: Ambulatory Visit | Attending: Obstetrics & Gynecology | Admitting: Obstetrics & Gynecology

## 2018-06-01 ENCOUNTER — Encounter (HOSPITAL_COMMUNITY): Payer: Self-pay | Admitting: Anesthesiology

## 2018-06-01 ENCOUNTER — Observation Stay (HOSPITAL_COMMUNITY): Payer: Managed Care, Other (non HMO) | Admitting: Anesthesiology

## 2018-06-01 ENCOUNTER — Encounter (HOSPITAL_COMMUNITY)
Admission: RE | Disposition: A | Discharge status: Home / Self Care | Payer: Self-pay | Source: Ambulatory Visit | Attending: Obstetrics & Gynecology

## 2018-06-01 DIAGNOSIS — Z9049 Acquired absence of other specified parts of digestive tract: Secondary | ICD-10-CM | POA: Insufficient documentation

## 2018-06-01 DIAGNOSIS — Z8249 Family history of ischemic heart disease and other diseases of the circulatory system: Secondary | ICD-10-CM | POA: Insufficient documentation

## 2018-06-01 DIAGNOSIS — D649 Anemia, unspecified: Secondary | ICD-10-CM | POA: Diagnosis not present

## 2018-06-01 DIAGNOSIS — N920 Excessive and frequent menstruation with regular cycle: Secondary | ICD-10-CM | POA: Diagnosis present

## 2018-06-01 DIAGNOSIS — F319 Bipolar disorder, unspecified: Secondary | ICD-10-CM | POA: Insufficient documentation

## 2018-06-01 DIAGNOSIS — Z79899 Other long term (current) drug therapy: Secondary | ICD-10-CM | POA: Insufficient documentation

## 2018-06-01 DIAGNOSIS — Z833 Family history of diabetes mellitus: Secondary | ICD-10-CM | POA: Diagnosis not present

## 2018-06-01 DIAGNOSIS — Z7951 Long term (current) use of inhaled steroids: Secondary | ICD-10-CM | POA: Diagnosis not present

## 2018-06-01 DIAGNOSIS — E669 Obesity, unspecified: Secondary | ICD-10-CM | POA: Insufficient documentation

## 2018-06-01 DIAGNOSIS — D259 Leiomyoma of uterus, unspecified: Secondary | ICD-10-CM | POA: Diagnosis not present

## 2018-06-01 DIAGNOSIS — N939 Abnormal uterine and vaginal bleeding, unspecified: Secondary | ICD-10-CM

## 2018-06-01 DIAGNOSIS — J45909 Unspecified asthma, uncomplicated: Secondary | ICD-10-CM | POA: Insufficient documentation

## 2018-06-01 DIAGNOSIS — Z6837 Body mass index (BMI) 37.0-37.9, adult: Secondary | ICD-10-CM | POA: Diagnosis not present

## 2018-06-01 HISTORY — PX: TOTAL LAPAROSCOPIC HYSTERECTOMY WITH SALPINGECTOMY: SHX6742

## 2018-06-01 LAB — PREGNANCY, URINE: PREG TEST UR: NEGATIVE

## 2018-06-01 SURGERY — HYSTERECTOMY, TOTAL, LAPAROSCOPIC, WITH SALPINGECTOMY
Anesthesia: General | Laterality: Bilateral

## 2018-06-01 MED ORDER — OXYCODONE HCL 5 MG PO TABS
5.0000 mg | ORAL_TABLET | Freq: Once | ORAL | Status: AC | PRN
  Administered 2018-06-01: 5 mg via ORAL

## 2018-06-01 MED ORDER — ACETAMINOPHEN 10 MG/ML IV SOLN: 1000.0000 mg | Freq: Once | INTRAVENOUS | Status: DC | PRN

## 2018-06-01 MED ORDER — FENTANYL CITRATE (PF) 100 MCG/2ML IJ SOLN: INTRAMUSCULAR | Status: DC | PRN

## 2018-06-01 MED ORDER — KETOROLAC TROMETHAMINE 30 MG/ML IJ SOLN
30.0000 mg | Freq: Four times a day (QID) | INTRAMUSCULAR | Status: DC
  Administered 2018-06-01 – 2018-06-02 (×2): 30 mg via INTRAVENOUS
  Filled 2018-06-01 (×2): qty 1

## 2018-06-01 MED ORDER — GABAPENTIN 300 MG PO CAPS
ORAL_CAPSULE | ORAL | Status: AC
  Administered 2018-06-01: 300 mg via ORAL
  Filled 2018-06-01: qty 1

## 2018-06-01 MED ORDER — OXYCODONE HCL 5 MG/5ML PO SOLN: 5.0000 mg | Freq: Once | ORAL | Status: AC | PRN

## 2018-06-01 MED ORDER — OXYCODONE HCL 5 MG PO TABS
ORAL_TABLET | ORAL | Status: AC
  Filled 2018-06-01: qty 1

## 2018-06-01 MED ORDER — ONDANSETRON HCL 4 MG PO TABS: 4.0000 mg | ORAL_TABLET | Freq: Four times a day (QID) | ORAL | Status: DC | PRN

## 2018-06-01 MED ORDER — ROCURONIUM BROMIDE 100 MG/10ML IV SOLN
INTRAVENOUS | Status: AC
  Filled 2018-06-01: qty 1

## 2018-06-01 MED ORDER — SCOPOLAMINE 1 MG/3DAYS TD PT72
1.0000 | MEDICATED_PATCH | Freq: Once | TRANSDERMAL | Status: DC
  Administered 2018-06-01: 1.5 mg via TRANSDERMAL

## 2018-06-01 MED ORDER — SUGAMMADEX SODIUM 200 MG/2ML IV SOLN
INTRAVENOUS | Status: DC | PRN
  Administered 2018-06-01: 210 mg via INTRAVENOUS

## 2018-06-01 MED ORDER — KETOROLAC TROMETHAMINE 30 MG/ML IJ SOLN
INTRAMUSCULAR | Status: AC
  Filled 2018-06-01: qty 1

## 2018-06-01 MED ORDER — ACETAMINOPHEN 500 MG PO TABS
ORAL_TABLET | ORAL | Status: AC
  Administered 2018-06-01: 1000 mg via ORAL
  Filled 2018-06-01: qty 2

## 2018-06-01 MED ORDER — MIDAZOLAM HCL 2 MG/2ML IJ SOLN
INTRAMUSCULAR | Status: DC | PRN
  Administered 2018-06-01: 2 mg via INTRAVENOUS

## 2018-06-01 MED ORDER — GLYCOPYRROLATE 0.2 MG/ML IJ SOLN
INTRAMUSCULAR | Status: DC | PRN
  Administered 2018-06-01: 0.1 mg via INTRAVENOUS

## 2018-06-01 MED ORDER — MOMETASONE FURO-FORMOTEROL FUM 100-5 MCG/ACT IN AERO
2.0000 | INHALATION_SPRAY | Freq: Two times a day (BID) | RESPIRATORY_TRACT | Status: DC
  Administered 2018-06-01 – 2018-06-02 (×2): 2 via RESPIRATORY_TRACT
  Filled 2018-06-01: qty 8.8

## 2018-06-01 MED ORDER — PROPOFOL 10 MG/ML IV BOLUS
INTRAVENOUS | Status: DC | PRN
  Administered 2018-06-01: 200 mg via INTRAVENOUS

## 2018-06-01 MED ORDER — ROCURONIUM BROMIDE 100 MG/10ML IV SOLN
INTRAVENOUS | Status: DC | PRN
  Administered 2018-06-01: 50 mg via INTRAVENOUS
  Administered 2018-06-01 (×2): 10 mg via INTRAVENOUS

## 2018-06-01 MED ORDER — BUPIVACAINE HCL (PF) 0.25 % IJ SOLN
INTRAMUSCULAR | Status: AC
  Filled 2018-06-01: qty 30

## 2018-06-01 MED ORDER — LIDOCAINE HCL (CARDIAC) PF 100 MG/5ML IV SOSY
PREFILLED_SYRINGE | INTRAVENOUS | Status: DC | PRN
  Administered 2018-06-01: 100 mg via INTRAVENOUS

## 2018-06-01 MED ORDER — SODIUM CHLORIDE (PF) 0.9 % IJ SOLN
INTRAMUSCULAR | Status: DC | PRN
  Administered 2018-06-01: 10 mL

## 2018-06-01 MED ORDER — LACTATED RINGERS IV SOLN
INTRAVENOUS | Status: DC
  Administered 2018-06-01: 14:00:00 via INTRAVENOUS
  Administered 2018-06-01: 125 mL/h via INTRAVENOUS
  Administered 2018-06-01: 16:00:00 via INTRAVENOUS

## 2018-06-01 MED ORDER — SUGAMMADEX SODIUM 200 MG/2ML IV SOLN
INTRAVENOUS | Status: AC
  Filled 2018-06-01: qty 2

## 2018-06-01 MED ORDER — KETOROLAC TROMETHAMINE 30 MG/ML IJ SOLN: 15.0000 mg | INTRAMUSCULAR | Status: DC

## 2018-06-01 MED ORDER — GENTAMICIN SULFATE 40 MG/ML IJ SOLN
INTRAVENOUS | Status: AC
  Administered 2018-06-01: 380 mg via INTRAVENOUS
  Filled 2018-06-01: qty 9.5

## 2018-06-01 MED ORDER — MIDAZOLAM HCL 2 MG/2ML IJ SOLN
INTRAMUSCULAR | Status: AC
  Filled 2018-06-01: qty 2

## 2018-06-01 MED ORDER — ACETAMINOPHEN 500 MG PO TABS
1000.0000 mg | ORAL_TABLET | ORAL | Status: AC
  Administered 2018-06-01: 1000 mg via ORAL

## 2018-06-01 MED ORDER — PROMETHAZINE HCL 25 MG/ML IJ SOLN: 6.2500 mg | INTRAMUSCULAR | Status: DC | PRN

## 2018-06-01 MED ORDER — METOCLOPRAMIDE HCL 5 MG/ML IJ SOLN
INTRAMUSCULAR | Status: DC | PRN
  Administered 2018-06-01: 10 mg via INTRAVENOUS

## 2018-06-01 MED ORDER — BUPIVACAINE HCL (PF) 0.25 % IJ SOLN
INTRAMUSCULAR | Status: DC | PRN
  Administered 2018-06-01: 30 mL

## 2018-06-01 MED ORDER — ALBUTEROL SULFATE (2.5 MG/3ML) 0.083% IN NEBU: 2.5000 mg | INHALATION_SOLUTION | RESPIRATORY_TRACT | Status: DC | PRN

## 2018-06-01 MED ORDER — SOD CITRATE-CITRIC ACID 500-334 MG/5ML PO SOLN
ORAL | Status: AC
  Administered 2018-06-01: 30 mL via ORAL
  Filled 2018-06-01: qty 15

## 2018-06-01 MED ORDER — FENTANYL CITRATE (PF) 100 MCG/2ML IJ SOLN
25.0000 ug | INTRAMUSCULAR | Status: DC | PRN
  Administered 2018-06-01 (×2): 50 ug via INTRAVENOUS

## 2018-06-01 MED ORDER — SCOPOLAMINE 1 MG/3DAYS TD PT72: 1.0000 | MEDICATED_PATCH | TRANSDERMAL | Status: DC

## 2018-06-01 MED ORDER — ONDANSETRON HCL 4 MG/2ML IJ SOLN: 4.0000 mg | Freq: Four times a day (QID) | INTRAMUSCULAR | Status: DC | PRN

## 2018-06-01 MED ORDER — KETOROLAC TROMETHAMINE 30 MG/ML IJ SOLN: 30.0000 mg | Freq: Four times a day (QID) | INTRAMUSCULAR | Status: DC

## 2018-06-01 MED ORDER — HYDROMORPHONE HCL 1 MG/ML IJ SOLN
INTRAMUSCULAR | Status: AC
  Filled 2018-06-01: qty 1

## 2018-06-01 MED ORDER — LACTATED RINGERS IV SOLN
INTRAVENOUS | Status: DC
  Administered 2018-06-01 – 2018-06-02 (×2): via INTRAVENOUS

## 2018-06-01 MED ORDER — KETOROLAC TROMETHAMINE 30 MG/ML IJ SOLN
30.0000 mg | Freq: Once | INTRAMUSCULAR | Status: AC
  Administered 2018-06-01: 30 mg via INTRAVENOUS

## 2018-06-01 MED ORDER — LIDOCAINE HCL (CARDIAC) PF 100 MG/5ML IV SOSY
PREFILLED_SYRINGE | INTRAVENOUS | Status: AC
  Filled 2018-06-01: qty 5

## 2018-06-01 MED ORDER — MENTHOL 3 MG MT LOZG
1.0000 | LOZENGE | OROMUCOSAL | Status: DC | PRN
  Filled 2018-06-01: qty 9

## 2018-06-01 MED ORDER — HYDROMORPHONE HCL 1 MG/ML IJ SOLN
INTRAMUSCULAR | Status: DC | PRN
  Administered 2018-06-01: 1 mg via INTRAVENOUS

## 2018-06-01 MED ORDER — ONDANSETRON HCL 4 MG/2ML IJ SOLN
INTRAMUSCULAR | Status: AC
  Filled 2018-06-01: qty 2

## 2018-06-01 MED ORDER — BUTORPHANOL TARTRATE 1 MG/ML IJ SOLN
1.0000 mg | INTRAMUSCULAR | Status: DC | PRN
  Administered 2018-06-01: 1 mg via INTRAVENOUS
  Filled 2018-06-01: qty 1

## 2018-06-01 MED ORDER — METOCLOPRAMIDE HCL 5 MG/ML IJ SOLN
INTRAMUSCULAR | Status: AC
  Filled 2018-06-01: qty 2

## 2018-06-01 MED ORDER — DEXAMETHASONE SODIUM PHOSPHATE 10 MG/ML IJ SOLN
INTRAMUSCULAR | Status: DC | PRN
  Administered 2018-06-01: 4 mg via INTRAVENOUS

## 2018-06-01 MED ORDER — SODIUM CHLORIDE 0.9 % IR SOLN
Status: DC | PRN
  Administered 2018-06-01: 3000 mL

## 2018-06-01 MED ORDER — PHENYLEPHRINE HCL 10 MG/ML IJ SOLN
INTRAMUSCULAR | Status: DC | PRN
  Administered 2018-06-01: 40 ug via INTRAVENOUS
  Administered 2018-06-01 (×2): 80 ug via INTRAVENOUS
  Administered 2018-06-01: 40 ug via INTRAVENOUS

## 2018-06-01 MED ORDER — LUBIPROSTONE 8 MCG PO CAPS
8.0000 ug | ORAL_CAPSULE | Freq: Every day | ORAL | Status: DC
  Filled 2018-06-01 (×2): qty 1

## 2018-06-01 MED ORDER — FENTANYL CITRATE (PF) 100 MCG/2ML IJ SOLN
INTRAMUSCULAR | Status: DC | PRN
  Administered 2018-06-01 (×5): 50 ug via INTRAVENOUS

## 2018-06-01 MED ORDER — SOD CITRATE-CITRIC ACID 500-334 MG/5ML PO SOLN
30.0000 mL | ORAL | Status: AC
  Administered 2018-06-01: 30 mL via ORAL

## 2018-06-01 MED ORDER — DOCUSATE SODIUM 100 MG PO CAPS
100.0000 mg | ORAL_CAPSULE | Freq: Two times a day (BID) | ORAL | Status: DC
  Administered 2018-06-01 – 2018-06-02 (×2): 100 mg via ORAL
  Filled 2018-06-01 (×5): qty 1

## 2018-06-01 MED ORDER — LACTATED RINGERS IV SOLN: INTRAVENOUS | Status: DC

## 2018-06-01 MED ORDER — SIMETHICONE 80 MG PO CHEW
80.0000 mg | CHEWABLE_TABLET | Freq: Four times a day (QID) | ORAL | Status: DC | PRN
  Administered 2018-06-02: 80 mg via ORAL
  Filled 2018-06-01 (×2): qty 1

## 2018-06-01 MED ORDER — FENTANYL CITRATE (PF) 250 MCG/5ML IJ SOLN
INTRAMUSCULAR | Status: AC
  Filled 2018-06-01: qty 5

## 2018-06-01 MED ORDER — OXYCODONE-ACETAMINOPHEN 5-325 MG PO TABS
2.0000 | ORAL_TABLET | ORAL | Status: DC | PRN
  Administered 2018-06-01 – 2018-06-02 (×3): 2 via ORAL
  Filled 2018-06-01 (×3): qty 2

## 2018-06-01 MED ORDER — DIPHENHYDRAMINE HCL 50 MG/ML IJ SOLN
INTRAMUSCULAR | Status: DC | PRN
  Administered 2018-06-01: 6.25 mg via INTRAVENOUS

## 2018-06-01 MED ORDER — PROPOFOL 10 MG/ML IV BOLUS
INTRAVENOUS | Status: AC
  Filled 2018-06-01: qty 20

## 2018-06-01 MED ORDER — GABAPENTIN 300 MG PO CAPS
300.0000 mg | ORAL_CAPSULE | ORAL | Status: AC
  Administered 2018-06-01: 300 mg via ORAL

## 2018-06-01 MED ORDER — SCOPOLAMINE 1 MG/3DAYS TD PT72
MEDICATED_PATCH | TRANSDERMAL | Status: AC
  Administered 2018-06-01: 1.5 mg via TRANSDERMAL
  Filled 2018-06-01: qty 1

## 2018-06-01 MED ORDER — ONDANSETRON HCL 4 MG/2ML IJ SOLN
INTRAMUSCULAR | Status: DC | PRN
  Administered 2018-06-01: 4 mg via INTRAVENOUS

## 2018-06-01 MED ORDER — PANTOPRAZOLE SODIUM 40 MG PO TBEC
40.0000 mg | DELAYED_RELEASE_TABLET | Freq: Every day | ORAL | Status: DC
  Administered 2018-06-02: 40 mg via ORAL
  Filled 2018-06-01 (×3): qty 1

## 2018-06-01 MED ORDER — SODIUM CHLORIDE (PF) 0.9 % IJ SOLN
INTRAMUSCULAR | Status: AC
  Filled 2018-06-01: qty 10

## 2018-06-01 MED ORDER — FENTANYL CITRATE (PF) 100 MCG/2ML IJ SOLN
INTRAMUSCULAR | Status: AC
  Filled 2018-06-01: qty 2

## 2018-06-01 MED ORDER — ALBUTEROL SULFATE HFA 108 (90 BASE) MCG/ACT IN AERS: 1.0000 | INHALATION_SPRAY | RESPIRATORY_TRACT | Status: DC | PRN

## 2018-06-01 SURGICAL SUPPLY — 42 items
ADH SKN CLS APL DERMABOND .7 (GAUZE/BANDAGES/DRESSINGS) ×1
APL SRG 38 LTWT LNG FL B (MISCELLANEOUS) ×1
APPLICATOR ARISTA FLEXITIP XL (MISCELLANEOUS) ×2 IMPLANT
CABLE HIGH FREQUENCY MONO STRZ (ELECTRODE) ×3 IMPLANT
COVER MAYO STAND STRL (DRAPES) ×3 IMPLANT
DERMABOND ADVANCED (GAUZE/BANDAGES/DRESSINGS) ×2
DERMABOND ADVANCED .7 DNX12 (GAUZE/BANDAGES/DRESSINGS) ×1 IMPLANT
DISSECTOR BLUNT TIP ENDO 5MM (MISCELLANEOUS) ×2 IMPLANT
DRSG OPSITE POSTOP 3X4 (GAUZE/BANDAGES/DRESSINGS) ×2 IMPLANT
DURAPREP 26ML APPLICATOR (WOUND CARE) ×3 IMPLANT
FILTER SMOKE EVAC LAPAROSHD (FILTER) ×3 IMPLANT
GAUZE PACKING IODOFORM 2 (PACKING) ×2 IMPLANT
GLOVE BIOGEL PI IND STRL 7.0 (GLOVE) ×5 IMPLANT
GLOVE BIOGEL PI INDICATOR 7.0 (GLOVE) ×10
GLOVE ECLIPSE 6.5 STRL STRAW (GLOVE) ×6 IMPLANT
HEMOSTAT ARISTA ABSORB 3G PWDR (MISCELLANEOUS) ×4 IMPLANT
NEEDLE INSUFFLATION 120MM (ENDOMECHANICALS) ×3 IMPLANT
OCCLUDER COLPOPNEUMO (BALLOONS) ×3 IMPLANT
PACK LAVH (CUSTOM PROCEDURE TRAY) ×3 IMPLANT
PACK ROBOTIC GOWN (GOWN DISPOSABLE) ×3 IMPLANT
PACK TRENDGUARD 450 HYBRID PRO (MISCELLANEOUS) IMPLANT
PAD OB MATERNITY 4.3X12.25 (PERSONAL CARE ITEMS) ×3 IMPLANT
POUCH LAPAROSCOPIC INSTRUMENT (MISCELLANEOUS) ×3 IMPLANT
PROTECTOR NERVE ULNAR (MISCELLANEOUS) ×6 IMPLANT
SET IRRIG TUBING LAPAROSCOPIC (IRRIGATION / IRRIGATOR) ×2 IMPLANT
SHEARS HARMONIC ACE PLUS 36CM (ENDOMECHANICALS) ×3 IMPLANT
SLEEVE XCEL OPT CAN 5 100 (ENDOMECHANICALS) ×6 IMPLANT
SOLUTION ELECTROLUBE (MISCELLANEOUS) ×2 IMPLANT
SUT MON AB 4-0 PS1 27 (SUTURE) ×3 IMPLANT
SUT VIC AB 0 CT1 27 (SUTURE) ×6
SUT VIC AB 0 CT1 27XBRD ANBCTR (SUTURE) ×2 IMPLANT
SUT VIC AB 0 CT1 36 (SUTURE) ×9 IMPLANT
SUT VICRYL 0 UR6 27IN ABS (SUTURE) IMPLANT
SYR 10ML LL (SYRINGE) ×3 IMPLANT
SYR 50ML LL SCALE MARK (SYRINGE) ×3 IMPLANT
TIP UTERINE 6.7X10CM GRN DISP (MISCELLANEOUS) ×2 IMPLANT
TOWEL OR 17X24 6PK STRL BLUE (TOWEL DISPOSABLE) ×6 IMPLANT
TRAY FOLEY W/BAG SLVR 14FR (SET/KITS/TRAYS/PACK) ×3 IMPLANT
TRENDGUARD 450 HYBRID PRO PACK (MISCELLANEOUS) ×3
TROCAR XCEL NON-BLD 5MMX100MML (ENDOMECHANICALS) ×3 IMPLANT
TUBING INSUF HEATED (TUBING) ×3 IMPLANT
WARMER LAPAROSCOPE (MISCELLANEOUS) ×3 IMPLANT

## 2018-06-01 NOTE — Anesthesia Postprocedure Evaluation (Signed)
Anesthesia Post Note  Patient: Michaela Broski  Procedure(s) Performed: TOTAL LAPAROSCOPIC HYSTERECTOMY WITH SALPINGECTOMY (Bilateral )     Patient location during evaluation: PACU Anesthesia Type: General Level of consciousness: awake and alert Pain management: pain level controlled Vital Signs Assessment: post-procedure vital signs reviewed and stable Respiratory status: spontaneous breathing, nonlabored ventilation and respiratory function stable Cardiovascular status: blood pressure returned to baseline and stable Postop Assessment: no apparent nausea or vomiting Anesthetic complications: no    Last Vitals:  Vitals:   06/01/18 1645 06/01/18 1700  BP: 115/76 122/80  Pulse: 75 (!) 57  Resp: 12 13  Temp:    SpO2: 100% 100%    Last Pain:  Vitals:   06/01/18 1645  TempSrc:   PainSc: 5    Pain Goal: Patients Stated Pain Goal: 5 (06/01/18 1645)               Brennan Bailey

## 2018-06-01 NOTE — Interval H&P Note (Signed)
History and Physical Interval Note:  06/01/2018 11:32 AM  Nancy Mckinney  has presented today for surgery, with the diagnosis of N92.0 Menorrhagia with regular cycle  The various methods of treatment have been discussed with the patient and family. After consideration of risks, benefits and other options for treatment, the patient has consented to  Procedure(s): TOTAL LAPAROSCOPIC HYSTERECTOMY WITH SALPINGECTOMY (Bilateral) as a surgical intervention .  The patient's history has been reviewed, patient examined, no change in status, stable for surgery.  I have reviewed the patient's chart and labs.  Questions were answered to the patient's satisfaction.     Annalee Genta

## 2018-06-01 NOTE — Plan of Care (Signed)
Pt admitted to room 315 for post op care and education

## 2018-06-01 NOTE — Anesthesia Procedure Notes (Signed)
Procedure Name: Intubation Date/Time: 06/01/2018 1:15 PM Performed by: Flossie Dibble, CRNA Pre-anesthesia Checklist: Patient identified, Patient being monitored, Timeout performed, Emergency Drugs available and Suction available Patient Re-evaluated:Patient Re-evaluated prior to induction Oxygen Delivery Method: Circle System Utilized Preoxygenation: Pre-oxygenation with 100% oxygen Induction Type: IV induction Ventilation: Mask ventilation without difficulty Laryngoscope Size: Mac and 3 Grade View: Grade I Tube type: Oral Tube size: 7.0 mm Number of attempts: 1 Airway Equipment and Method: stylet Placement Confirmation: ETT inserted through vocal cords under direct vision,  positive ETCO2 and breath sounds checked- equal and bilateral Secured at: 21 cm Tube secured with: Tape Dental Injury: Teeth and Oropharynx as per pre-operative assessment

## 2018-06-01 NOTE — Transfer of Care (Signed)
Immediate Anesthesia Transfer of Care Note  Patient: Nancy Mckinney  Procedure(s) Performed: TOTAL LAPAROSCOPIC HYSTERECTOMY WITH SALPINGECTOMY (Bilateral )  Patient Location: PACU  Anesthesia Type:General  Level of Consciousness: awake, alert  and oriented  Airway & Oxygen Therapy: Patient Spontanous Breathing and Patient connected to nasal cannula oxygen  Post-op Assessment: Report given to RN and Post -op Vital signs reviewed and stable  Post vital signs: Reviewed and stable  Last Vitals:  Vitals Value Taken Time  BP    Temp    Pulse 68 06/01/2018  3:47 PM  Resp 9 06/01/2018  3:48 PM  SpO2 97 % 06/01/2018  3:47 PM  Vitals shown include unvalidated device data.  Last Pain:  Vitals:   06/01/18 1116  TempSrc: Oral      Patients Stated Pain Goal: 5 (04/79/98 7215)  Complications: No apparent anesthesia complications

## 2018-06-01 NOTE — Op Note (Signed)
OPERATIVE NOTE  Nancy Mckinney  DOB:    1970-12-26  MRN:    [id number]  CSN:    [id number]  Date of Surgery:  06/01/2018  Preoperative Diagnosis: abnormal uterine bleeding  Postoperative Diagnosis: same  Procedure:  Total laparoscopic hysterectomy, bilateral salpingectomy  Surgeon: Dr. Janyth Pupa  Assistant: Dr. Christophe Louis Anesthetic: Choice  Disposition:  The patient presents with the above-mentioned diagnosis. She understands the indications for surgical procedure.  She also understands the alternative treatment options. She accepts the risk of, but not limited to, anesthetic complications, bleeding, infections, and possible damage to the surrounding organs.  Findings: 10cm boggy- appearing uterus.  Prior tubal ligation noted, normal fallopian tubes.  Left ovary with 3-4cm hemorrhagic cyst.  Normal right ovary.  Bowel and liver grossly norma.  Umbilical adhesions noted.  Procedure:  The patient was taken to the operating room where a general anesthetic was given. The patient's  abdomen was prepped with ChloraPrep. The perineum and vagina were prepped with multiple layers of Betadine. A Foley catheter was placed in the bladder. An examination under anesthesia was performed. A Hulka tenaculum was placed inside the uterus. The patient was sterilely draped. The subumbilical area was injected with quarter percent Marcaine with epinephrine. An incision was made and the Veress needle was inserted into the abdominal cavity without difficulty. Proper placement was confirmed using the saline drop test. A pneumoperitoneum was obtained. The laparoscopic trocar and the laparoscope were substituted for the Veress needle. Operative findings as mentioned above. The mid abdomen was injected with quarter percent Marcaine to the right of the midline. A small incision was made and a 5 mm trocar was inserted into the abdominal cavity under direct visualization. An identical procedure was carried out on  the opposite side. Pictures were taken of the patient's pelvic structures.   The left fallopian tube was identified and followed to its fimbriated end. The distal portion of the left fallopian tube was cauterized and cut using the harmonic.  The left round ligament was identified. The round ligament was cauterized and cut using the Harmonic The left utero-ovarian ligament was cauterized and cut.  The bladder flap was developed anteriorly.  Skeletonization of the left uterine artery was performed.  Attention was turned to the right side.  In a similar fashion, the right fallopian tube, right upper pedicle, and the right utero-ovarian ligament were cauterized and cut using the harmonic.  The right uterine artery was skeletonized. The right uterine artery was clamped and ligated with the Kleppinger.  The left uterine artery was ligated in a similar fashion.  The bladder flap was adequately developed and the manipulator was palpated.  A circumferential incision was made around the cervix using the laparoscopic hook. The uterus, cervix and fallopian tubes were then removed.  Attention was then turned vaginally.  The vaginal cuff was closed in a running fashion. Iodoform packing was placed.  Gown and gloves were changed and the case returned to laparoscopy. The pneumoperitoneum was reestablished. The pelvis was inspected and hemostasis was adequate. The pelvis was irrigated. Arista was placed. The 5 mm trocars were removed under direct visualization. The pneumoperitoneum was allowed to escape. The subumbilical trocar was removed. The subumbilical incision was closed using 3-0 Monocryl and dermabond. The patient tolerated the procedure well. She was awakened from her anesthetic without difficulty and then transported to the recovery room in stable condition. Sponge, needle, and instrument counts were correct on 2 occasions. The estimated blood loss was 150 cc's. 0  Vicryl is the suture material used throughout the  procedure. The uterus was sent to pathology.  Weight of specimen: [place], DO [phone] (cell) [phone] (office)

## 2018-06-02 ENCOUNTER — Encounter (HOSPITAL_COMMUNITY): Payer: Self-pay | Admitting: Obstetrics & Gynecology

## 2018-06-02 DIAGNOSIS — D259 Leiomyoma of uterus, unspecified: Secondary | ICD-10-CM | POA: Diagnosis not present

## 2018-06-02 LAB — BASIC METABOLIC PANEL
ANION GAP: 7 (ref 5–15)
BUN: 10 mg/dL (ref 6–20)
CHLORIDE: 107 mmol/L (ref 98–111)
CO2: 22 mmol/L (ref 22–32)
Calcium: 7.9 mg/dL — ABNORMAL LOW (ref 8.9–10.3)
Creatinine, Ser: 0.72 mg/dL (ref 0.44–1.00)
GFR calc Af Amer: >60 mL/min (ref >60)
GFR calc non Af Amer: >60 mL/min (ref >60)
GLUCOSE: 136 mg/dL — AB (ref 70–99)
Potassium: 3.8 mmol/L (ref 3.5–5.1)
Sodium: 136 mmol/L (ref 135–145)

## 2018-06-02 LAB — CBC
HEMATOCRIT: 29.6 % — AB (ref 36.0–46.0)
HEMOGLOBIN: 9.1 g/dL — AB (ref 12.0–15.0)
MCH: 24.5 pg — ABNORMAL LOW (ref 26.0–34.0)
MCHC: 30.7 g/dL (ref 30.0–36.0)
MCV: 79.8 fL — AB (ref 80.0–100.0)
Platelets: 111 10*3/uL — ABNORMAL LOW (ref 150–400)
RBC: 3.71 MIL/uL — ABNORMAL LOW (ref 3.87–5.11)
RDW: 15.3 % (ref 11.5–15.5)
WBC: 4.9 10*3/uL (ref 4.0–10.5)
nRBC: 0 % (ref 0.0–0.2)

## 2018-06-02 MED ORDER — OXYCODONE-ACETAMINOPHEN 5-325 MG PO TABS: 1.0000 | ORAL_TABLET | Freq: Four times a day (QID) | ORAL | 0 refills | Status: AC | PRN

## 2018-06-02 MED ORDER — NAPROXEN 375 MG PO TABS: 375.0000 mg | ORAL_TABLET | Freq: Three times a day (TID) | ORAL | 0 refills | Status: AC

## 2018-06-02 MED ORDER — NAPROXEN 250 MG PO TABS
375.0000 mg | ORAL_TABLET | Freq: Three times a day (TID) | ORAL | Status: DC
  Administered 2018-06-02: 375 mg via ORAL
  Filled 2018-06-02 (×2): qty 2

## 2018-06-02 MED ORDER — DOCUSATE SODIUM 100 MG PO CAPS: 100.0000 mg | ORAL_CAPSULE | Freq: Two times a day (BID) | ORAL | 0 refills | Status: AC

## 2018-06-02 NOTE — Progress Notes (Signed)
Teaching complete  Questions answered

## 2018-06-02 NOTE — Anesthesia Postprocedure Evaluation (Signed)
Anesthesia Post Note  Patient: Nancy Mckinney  Procedure(s) Performed: TOTAL LAPAROSCOPIC HYSTERECTOMY WITH SALPINGECTOMY (Bilateral )     Patient location during evaluation: [facility] Anesthesia Type: General Level of consciousness: awake and alert Pain management: pain level controlled Vital Signs Assessment: post-procedure vital signs reviewed and stable Respiratory status: spontaneous breathing, nonlabored ventilation and respiratory function stable Cardiovascular status: stable Postop Assessment: no apparent nausea or vomiting, adequate PO intake and able to ambulate Anesthetic complications: no    Last Vitals:  Vitals:   06/02/18 0400 06/02/18 0741  BP: 116/69 117/66  Pulse: 81 66  Resp: 20 18  Temp: 36.7 C 37.2 C  SpO2: 100% 100%    Last Pain:  Vitals:   06/02/18 0741  TempSrc: Oral  PainSc:    Pain Goal: Patients Stated Pain Goal: 3 (06/02/18 0548)               Jabier Mutton

## 2018-06-02 NOTE — Addendum Note (Signed)
Addendum  created 06/02/18 0813 by Hewitt Blade, CRNA   Sign clinical note

## 2018-06-02 NOTE — Discharge Instructions (Signed)
Total Laparoscopic Hysterectomy, Care After Refer to this sheet in the next few weeks. These instructions provide you with information on caring for yourself after your procedure. Your health care provider may also give you more specific instructions. Your treatment has been planned according to current medical practices, but problems sometimes occur. Call your health care provider if you have any problems or questions after your procedure. What can I expect after the procedure?  Pain and bruising at the incision sites. You will be given pain medicine to control it.  Menopausal symptoms such as hot flashes, night sweats, and insomnia if your ovaries were removed.  Sore throat from the breathing tube that was inserted during surgery. Follow these instructions at home:  Only take over-the-counter or prescription medicines for pain, discomfort, or fever as directed by your health care provider.  For pain medication- please alternate between ibuprofen and percocet.  IF the percocet is too strong take plain tylenol.  (The percocet has tylenol in it).  Percocet may also cause constipation, so be sure to continue with a stool softener twice daily while taking this medication  Do not take aspirin. It can cause bleeding.  Do not drive when taking pain medicine.  Follow your health care provider's advice regarding diet, exercise, lifting, driving, and general activities.  Resume your usual diet as directed and allowed.  Get plenty of rest and sleep.  Do not douche, use tampons, or have sexual intercourse for at least 6 weeks, or until your health care provider gives you permission.  Change your bandages (dressings) as directed by your health care provider.  Monitor your temperature and notify your health care provider of a fever.  Take showers instead of baths for 2-3 weeks.  Do not drink alcohol until your health care provider gives you permission.  If you develop constipation, you may take a  mild laxative with your health care provider's permission. Bran foods may help with constipation problems. Drinking enough fluids to keep your urine clear or pale yellow may help as well.  Try to have someone home with you for 1-2 weeks to help around the house.  Keep all of your follow-up appointments as directed by your health care provider. Contact a health care provider if:  You have swelling, redness, or increasing pain around your incision sites.  You have pus coming from your incision.  You notice a bad smell coming from your incision.  Your incision breaks open.  You feel dizzy or lightheaded.  You have pain or bleeding when you urinate.  You have persistent diarrhea.  You have persistent nausea and vomiting.  You have abnormal vaginal discharge.  You have a rash.  You have any type of abnormal reaction or develop an allergy to your medicine.  You have poor pain control with your prescribed medicine. Get help right away if:  You have chest pain or shortness of breath.  You have severe abdominal pain that is not relieved with pain medicine.  You have pain or swelling in your legs. This information is not intended to replace advice given to you by your health care provider. Make sure you discuss any questions you have with your health care provider. Document Released: 04/19/2013 Document Revised: 12/05/2015 Document Reviewed: 01/17/2013 Elsevier Interactive Patient Education  2017 [employer].

## 2018-06-02 NOTE — Progress Notes (Signed)
Postop Note Day # 1  S:  Patient resting comfortable in bed.  Pain controlled.  Tolerating general diet. + flatus, no BM.  Minimal bleeding- packing and foley removed this am.  Ambulating without difficulty.  She denies n/v/f/c, SOB, or CP.  Pt plans on breastfeeding.  O: Temp:  [98 F (36.7 C)-99.5 F (37.5 C)] 98.1 F (36.7 C) (11/21 0400) Pulse Rate:  [57-99] 81 (11/21 0400) Resp:  [9-20] 20 (11/21 0400) BP: (108-155)/(53-103) 116/69 (11/21 0400) SpO2:  [97 %-100 %] 100 % (11/21 0400)   Gen: A&Ox3, NAD CV: RRR Resp: CTAB Abdomen: soft, NT, ND, +BS Incision: c/d/i, bandage on Ext: No edema, no calf tenderness bilaterally, SCDs in place  Labs:  Results for orders placed or performed during the hospital encounter of 06/01/18 (from the past 24 hour(s))  Pregnancy, urine     Status: None   Collection Time: 06/01/18 11:00 AM  Result Value Ref Range   Preg Test, Ur NEGATIVE NEGATIVE  CBC     Status: Abnormal   Collection Time: 06/02/18  5:24 AM  Result Value Ref Range   WBC 4.9 4.0 - 10.5 K/uL   RBC 3.71 (L) 3.87 - 5.11 MIL/uL   Hemoglobin 9.1 (L) 12.0 - 15.0 g/dL   HCT 29.6 (L) 36.0 - 46.0 %   MCV 79.8 (L) 80.0 - 100.0 fL   MCH 24.5 (L) 26.0 - 34.0 pg   MCHC 30.7 30.0 - 36.0 g/dL   RDW 15.3 11.5 - 15.5 %   Platelets 111 (L) 150 - 400 K/uL   nRBC 0.0 0.0 - 0.2 %  Basic metabolic panel     Status: Abnormal   Collection Time: 06/02/18  5:24 AM  Result Value Ref Range   Sodium 136 135 - 145 mmol/L   Potassium 3.8 3.5 - 5.1 mmol/L   Chloride 107 98 - 111 mmol/L   CO2 22 22 - 32 mmol/L   Glucose, Bld 136 (H) 70 - 99 mg/dL   BUN 10 6 - 20 mg/dL   Creatinine, Ser 0.72 0.44 - 1.00 mg/dL   Calcium 7.9 (L) 8.9 - 10.3 mg/dL   GFR calc non Af Amer >60 >60 mL/min   GFR calc Af Amer >60 >60 mL/min   Anion gap 7 5 - 15    A/P: Pt is a 47 y.o. s/p LAVH- PPD#1  - Pain well controlled -GU: UOP is adequate, pt to void on her own this am -GI: Tolerating general diet -Activity:  encouraged sitting up to chair and ambulation as tolerated -Prophylaxis: encourage ambulation -Labs: stable as above  Meeting postop milestones appropriately, plan for discharge home later today if she is voiding  Janyth Pupa, DO [phone] (cell) [phone] (office)

## 2018-06-03 NOTE — Discharge Summary (Signed)
Physician Discharge Summary  Patient ID: Nancy Mckinney MRN: [id number] DOB/AGE: 48-Oct-1972 48 y.o.  Admit date: 06/01/2018 Discharge date: 06/02/2018  Admission Diagnoses: Abnormal uterine bleeding  Discharge Diagnoses:  Active Problems:   Abnormal uterine bleeding   Discharged Condition: stable  Hospital Course: 47yo female who was admitted for schedule hysterectomy due to abnormal bleeding.  She underwent a TOTAL LAPAROSCOPIC HYSTERECTOMY WITH BILATERAL SALPINGECTOMY- please see operative note regarding procedure.  Her postop course was uncomplicated and she was discharge home in stable condition on POD#1  Consults: None  Significant Diagnostic Studies: labs:  CBC Latest Ref Rng & Units 06/02/2018 05/27/2018 12/29/2017  WBC 4.0 - 10.5 K/uL 4.9 3.0(L) 4.4  Hemoglobin 12.0 - 15.0 g/dL 9.1(L) 11.2(L) 11.7(L)  Hematocrit 36.0 - 46.0 % 29.6(L) 35.6(L) 37.0  Platelets 150 - 400 K/uL 111(L) 136(L) 184     Treatments: IV hydration, antibiotics: gentamycin and clindamycin, analgesia: Stadol, Toradol and surgery: TOTAL LAPAROSCOPIC HYSTERECTOMY WITH BILATERAL SALPINGECTOMY   Discharge Exam: Blood pressure (!) 155/83, pulse 79, temperature 98.9 F (37.2 C), temperature source Oral, resp. rate 18, height 5\' 5"  (1.651 m), weight 102.1 kg, last menstrual period 05/18/2018, SpO2 100 %. Gen: A&Ox3, NAD CV: RRR Resp: CTAB Abdomen: soft, NT, ND, +BS Incision: c/d/i, bandage on Ext: No edema, no calf tenderness bilaterally, SCDs in place  Disposition:    Allergies as of 06/02/2018      Reactions   Cephalosporins Diarrhea      Medication List    STOP taking these medications   naproxen sodium 220 MG tablet Commonly known as:  ALEVE     TAKE these medications   ADVAIR DISKUS 100-50 MCG/DOSE Aepb Generic drug:  Fluticasone-Salmeterol Inhale 1 puff into the lungs 2 (two) times daily.   albuterol 108 (90 Base) MCG/ACT inhaler Commonly known as:  PROVENTIL HFA;VENTOLIN  HFA Inhale 1-2 puffs into the lungs every 4 (four) hours as needed for wheezing or shortness of breath. What changed:  how much to take   docusate sodium 100 MG capsule Commonly known as:  COLACE Take 1 capsule (100 mg total) by mouth 2 (two) times daily.   lubiprostone 8 MCG capsule Commonly known as:  AMITIZA Take 8 mcg by mouth daily with breakfast.   naproxen 375 MG tablet Commonly known as:  NAPROSYN Take 1 tablet (375 mg total) by mouth 3 (three) times daily with meals.   oxyCODONE-acetaminophen 5-325 MG tablet Commonly known as:  PERCOCET/ROXICET Take 1 tablet by mouth every 6 (six) hours as needed for severe pain.   PRENATAL VITAMIN PO Take 1 tablet by mouth daily.      Follow-up Information    Janyth Pupa, DO Follow up in 2 week(s).   Specialty:  Obstetrics and Gynecology Contact information: [address] [place] [address] [place] [address] [phone]           Signed: Annalee Genta 06/03/2018, 6:55 AM

## 2018-10-26 IMAGING — US US ABDOMEN LIMITED
1 series · 14 of 25 positions shown · non-contrast
Comparison: None.

CLINICAL DATA: Right upper quadrant pain.  Nausea and vomiting.

EXAM:
ULTRASOUND ABDOMEN LIMITED RIGHT UPPER QUADRANT

[Series 1: us abdomen limited · 0.25mm/px · 14 of 39 slices shown]
[im 1/39]
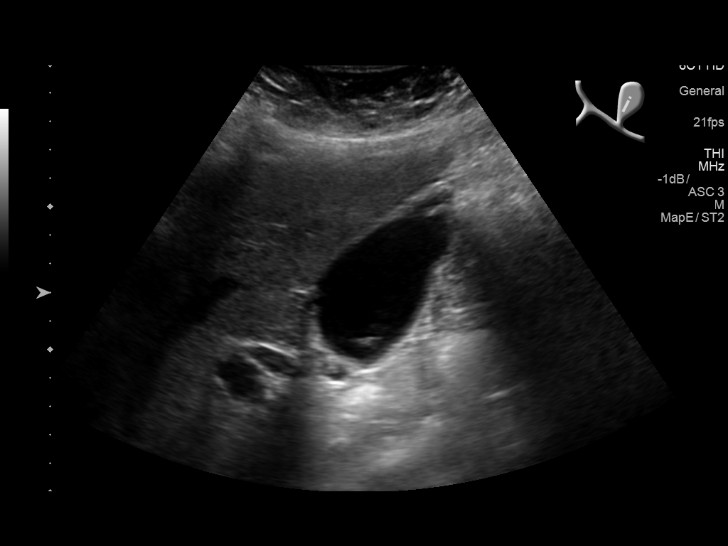
[im 4/39]
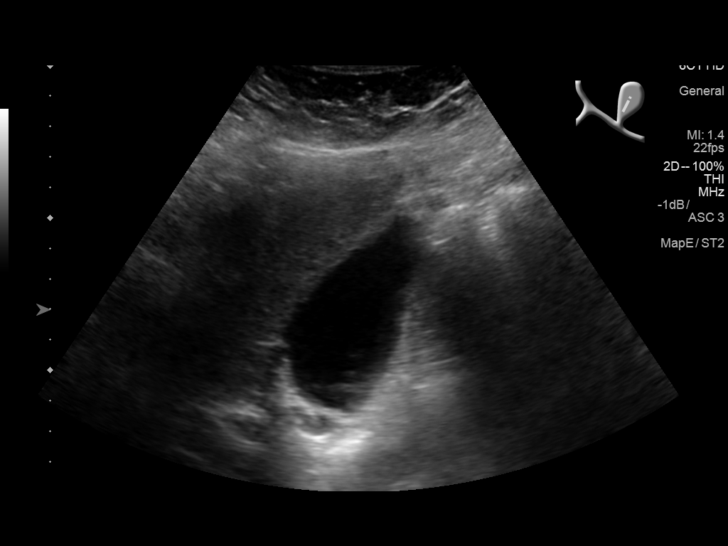
[im 7/39]
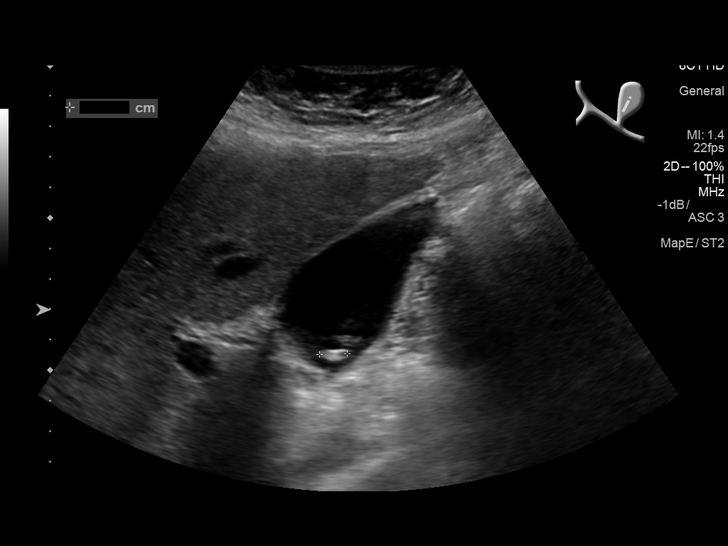
[im 10/39]
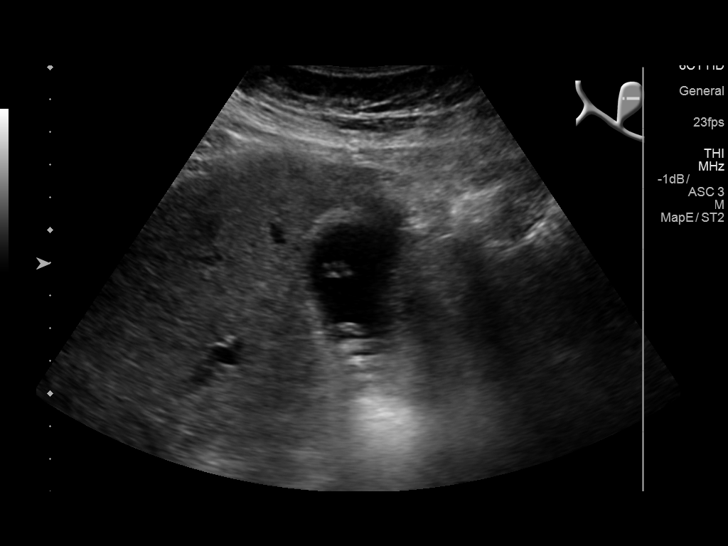
[im 13/39]
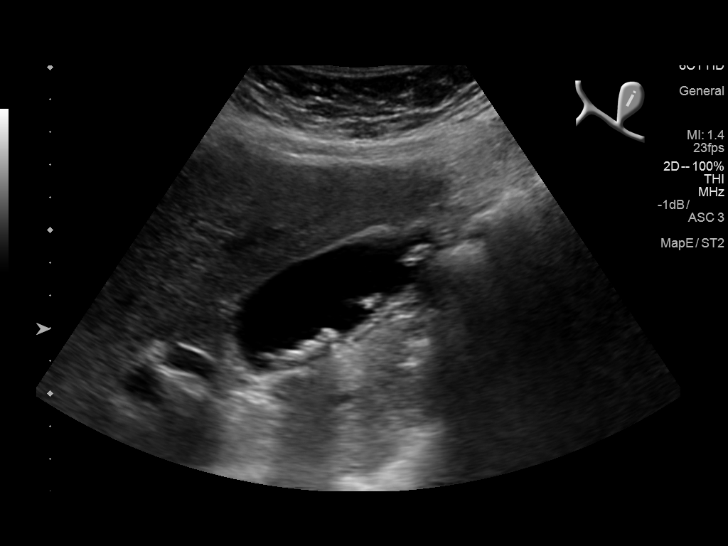
[im 15/39]
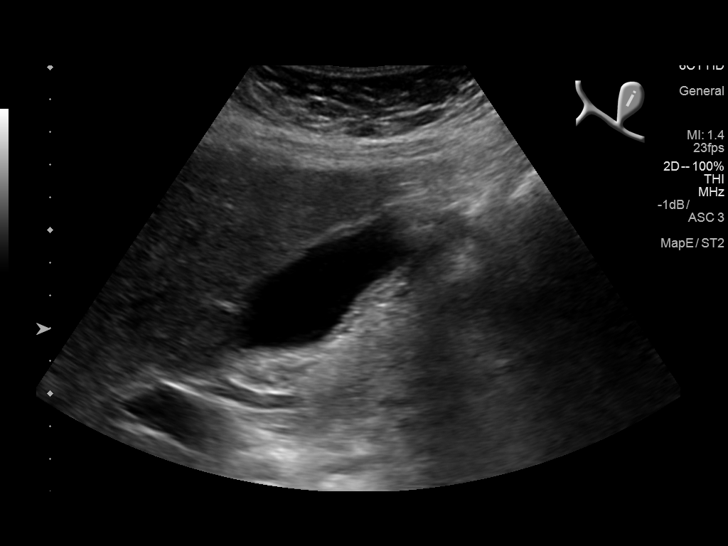
[im 18/39]
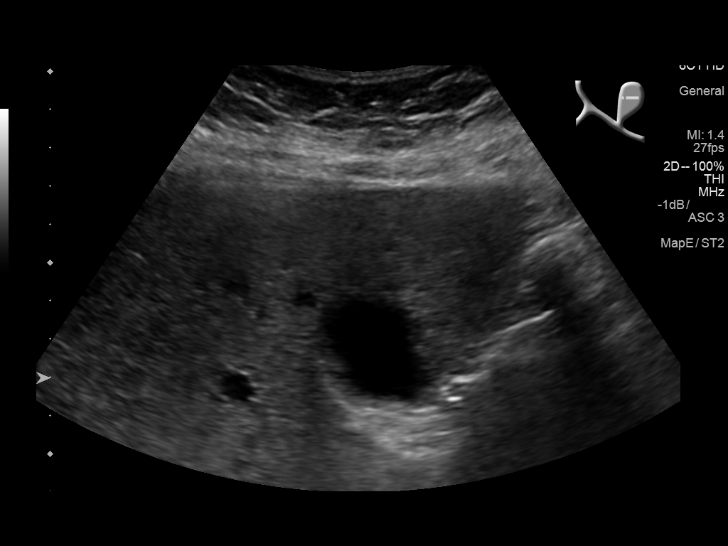
[im 21/39]
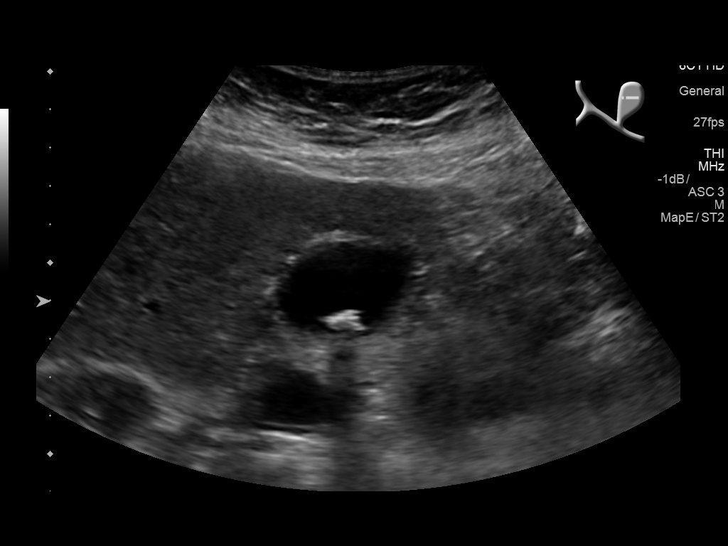
[im 24/39]
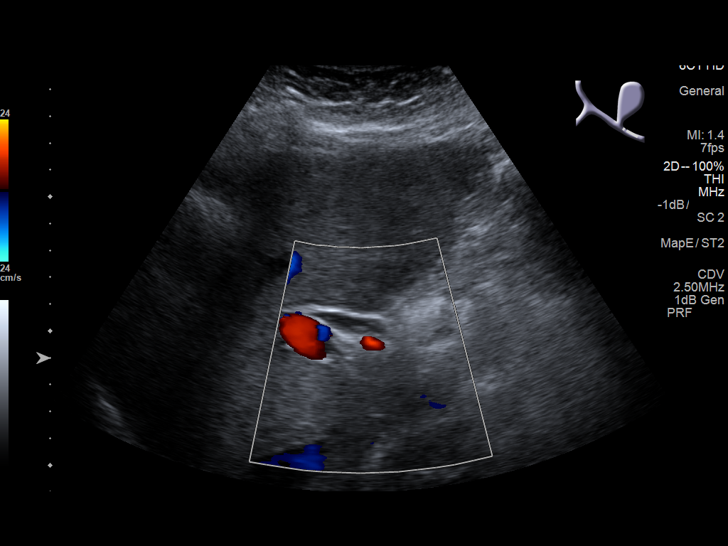
[im 26/39]
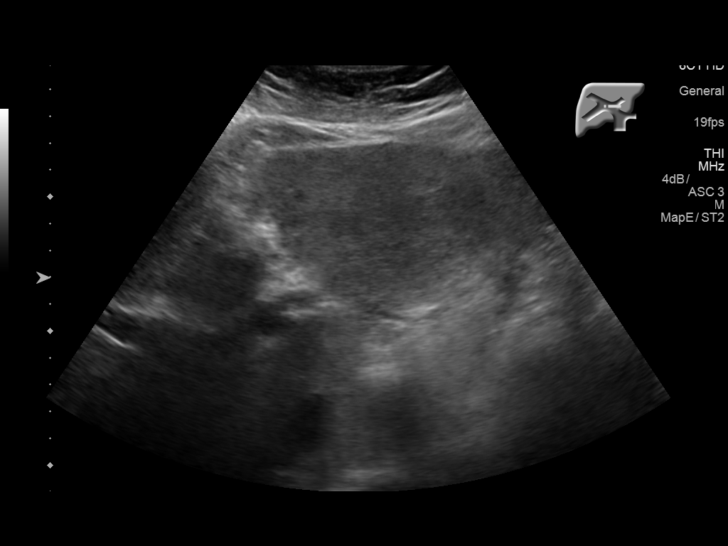
[im 29/39]
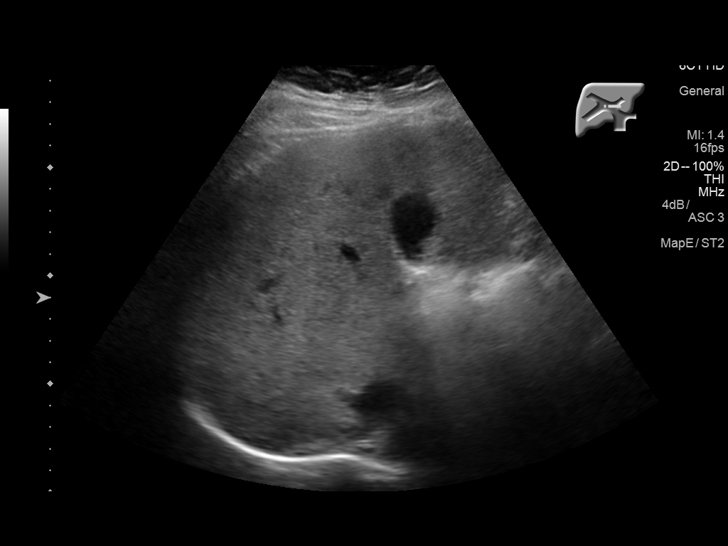
[im 32/39]
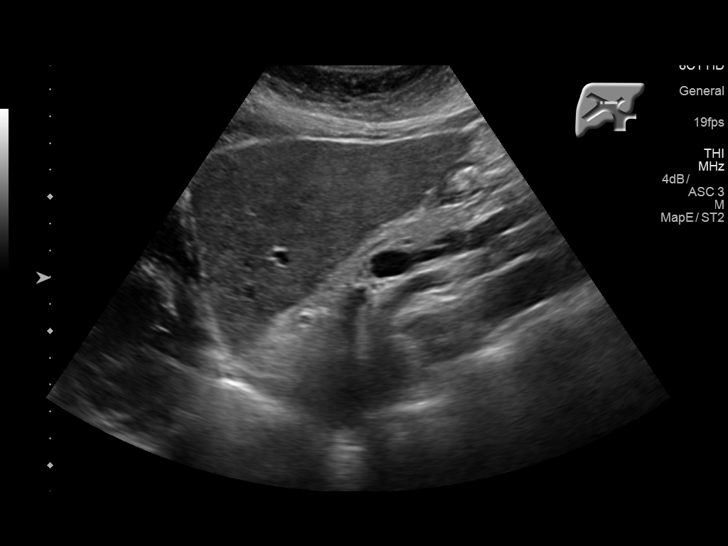
[im 35/39]
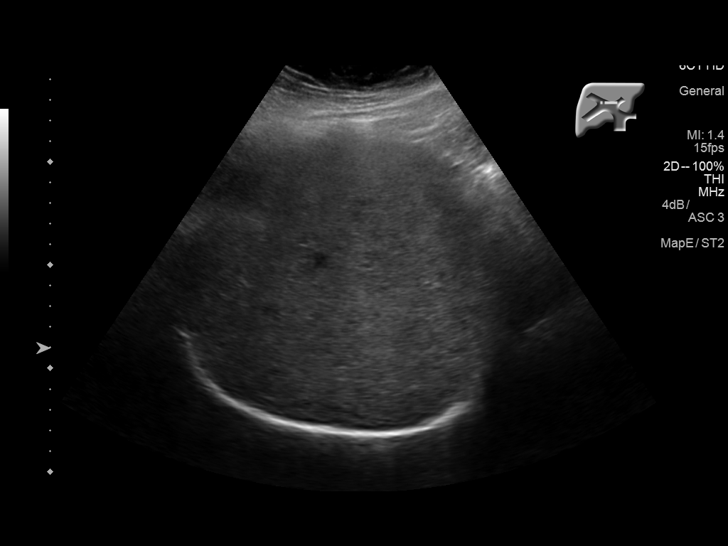
[im 39/39]
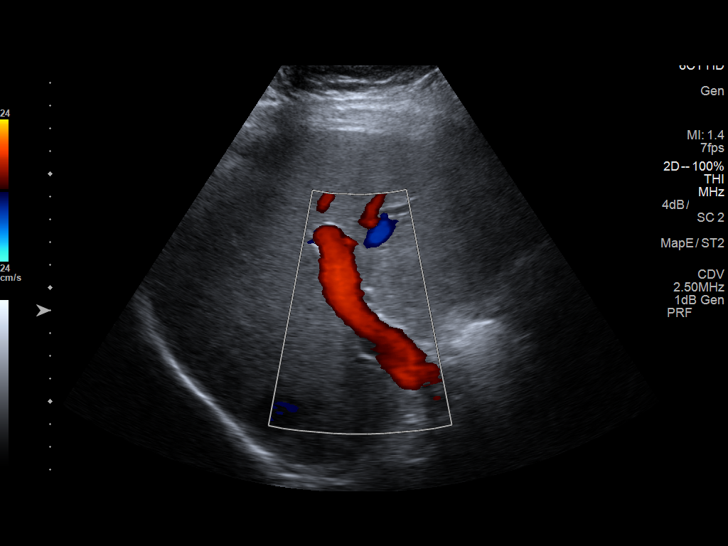

[14 of 25 positions shown; findings below may reference images not displayed]

FINDINGS: Gallbladder:

The gallbladder wall is mildly thickened measuring 4 mm.
Pericholecystic fluid noted. Multiple stones identified. Positive
sonographic Murphy sign reported by the sonographer.

Common bile duct:

Diameter: 9 mm

Liver:

No focal lesion identified. Within normal limits in parenchymal
echogenicity.
IMPRESSION: 1. Imaging findings suggestive of cholecystitis including:
Gallstones, positive sonographic Murphy's sign, pericholecystic
fluid and gallbladder wall thickening.

## 2018-12-09 IMAGING — CT CT ABD-PELV W/ CM
2 of 5 series · 15 of 46 positions shown, 17 images · IV contrast (iopamidol)
Comparison: Abdominal ultrasound 03/10/2017.

CLINICAL DATA: Abdominal pain, unspecified. Abdominal pain and
bloating for 3 days. Mid abdominal pain. Personal history of
irritable bowel syndrome. Personal history of cirrhosis.

EXAM:
CT ABDOMEN AND PELVIS WITH CONTRAST
TECHNIQUE: Multidetector CT imaging of the abdomen and pelvis was performed
using the standard protocol following bolus administration of
intravenous contrast.
CONTRAST:  100mL [id number] IOPAMIDOL ([id number]) INJECTION 61%

[Series 2: axial st · axial · 0.98mm/px · z∈[+706,+1101]mm · 12 of 89 slices shown, 14 images]
[im 5/89  soft-tissue]
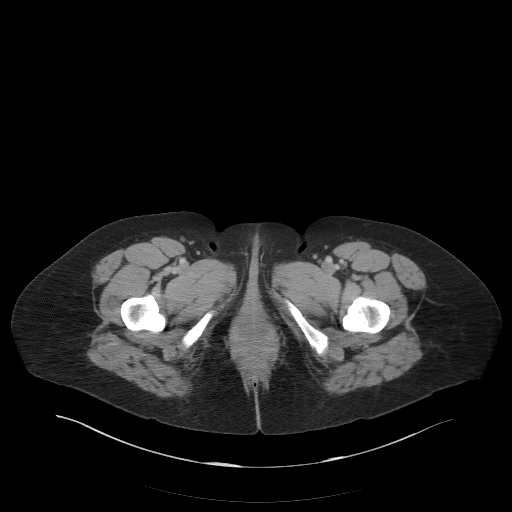
[im 5/89  bone]
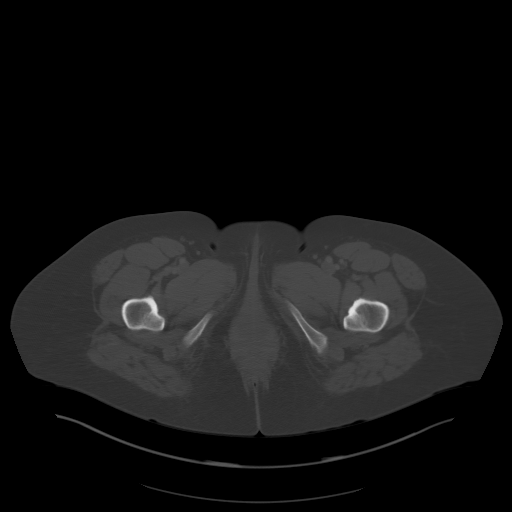
[im 14/89  soft-tissue]
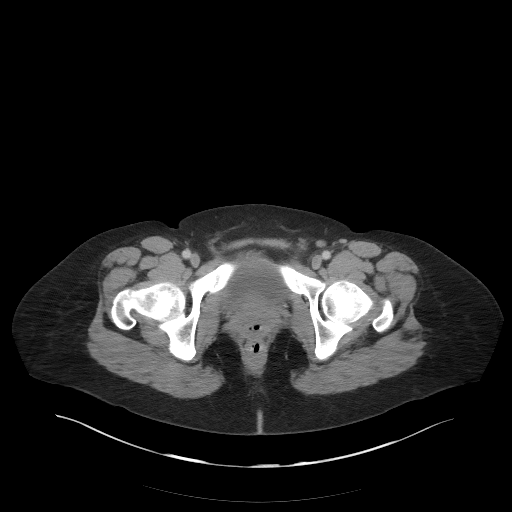
[im 19/89  soft-tissue]
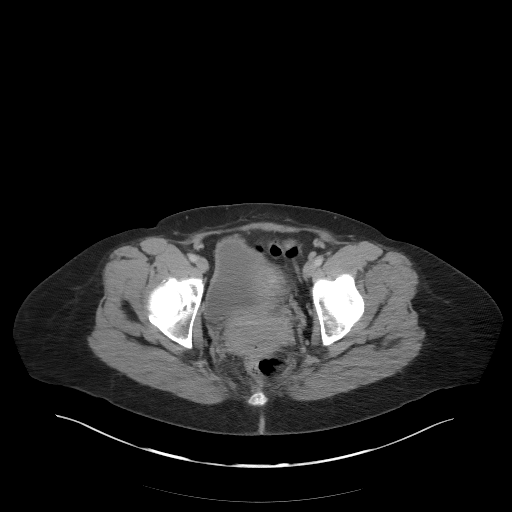
[im 28/89  soft-tissue]
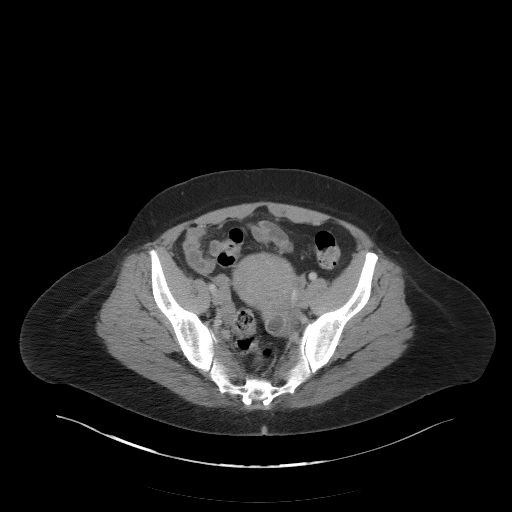
[im 33/89  soft-tissue]
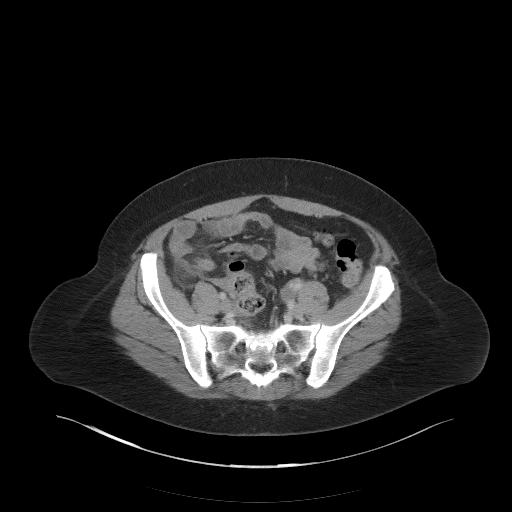
[im 42/89  soft-tissue]
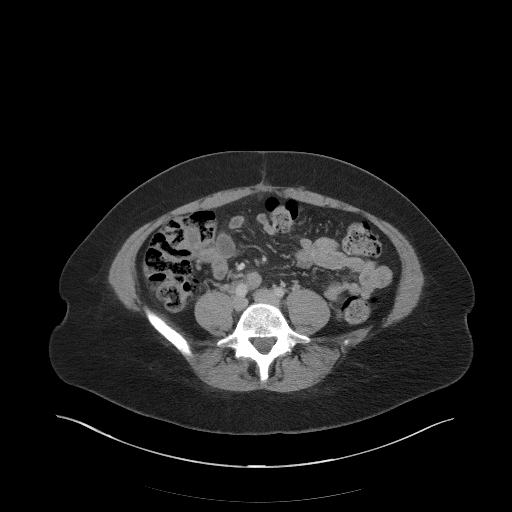
[im 47/89  soft-tissue]
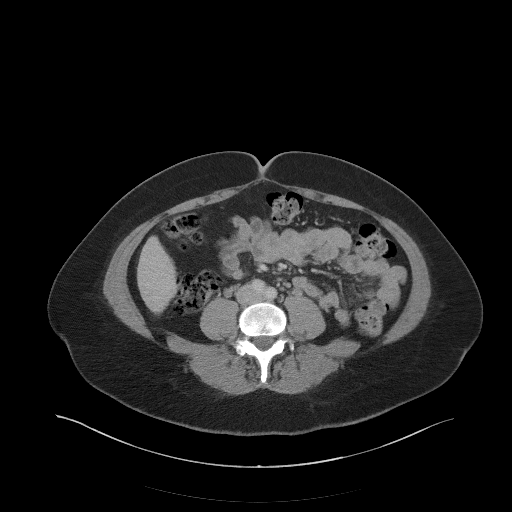
[im 56/89  soft-tissue]
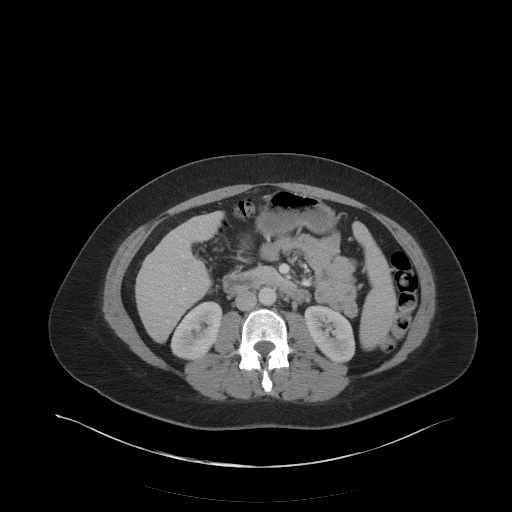
[im 61/89  soft-tissue]
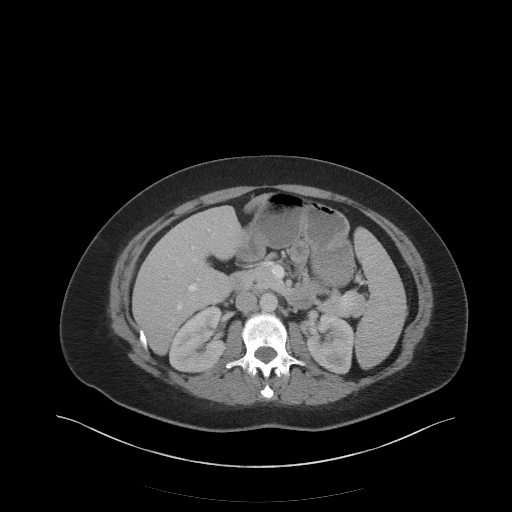
[im 61/89  bone]
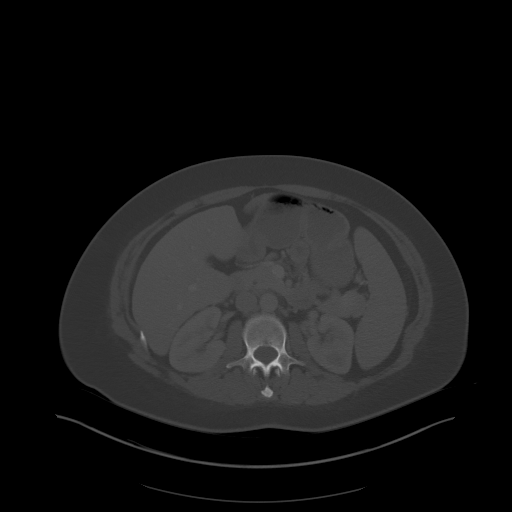
[im 70/89  soft-tissue]
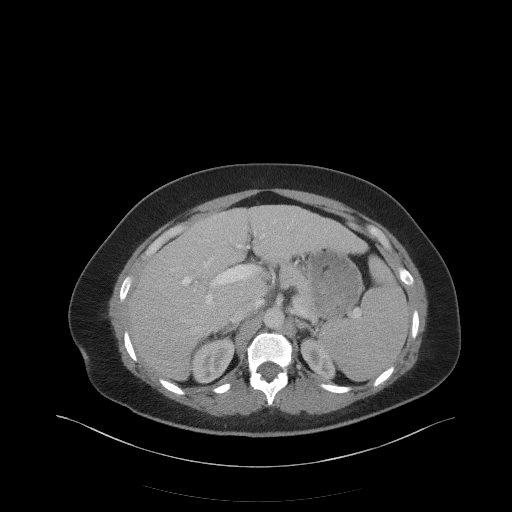
[im 75/89  soft-tissue]
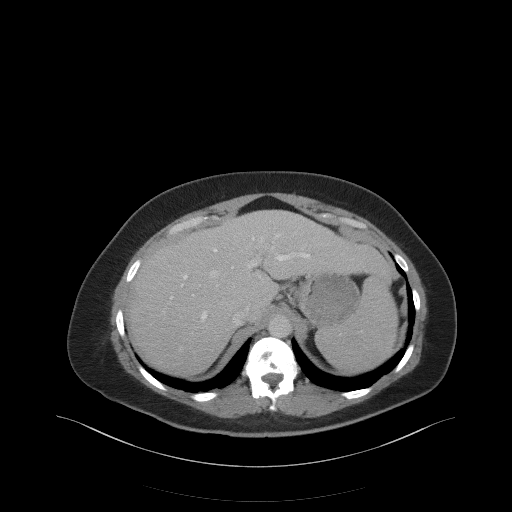
[im 84/89  soft-tissue]
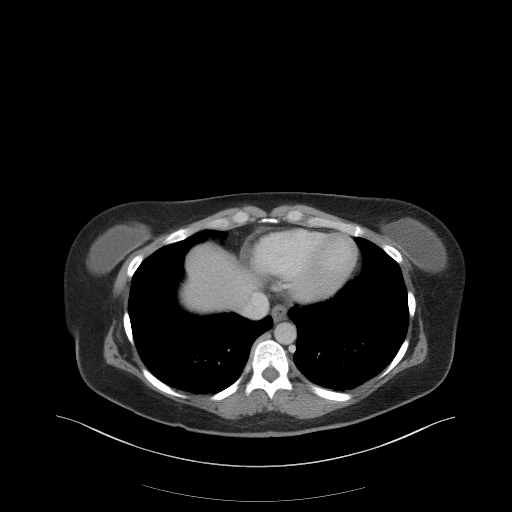

[Series 5: coronal st · coronal · 0.80mm/px · 3 of 94 slices shown]
[im 32/94  soft-tissue]
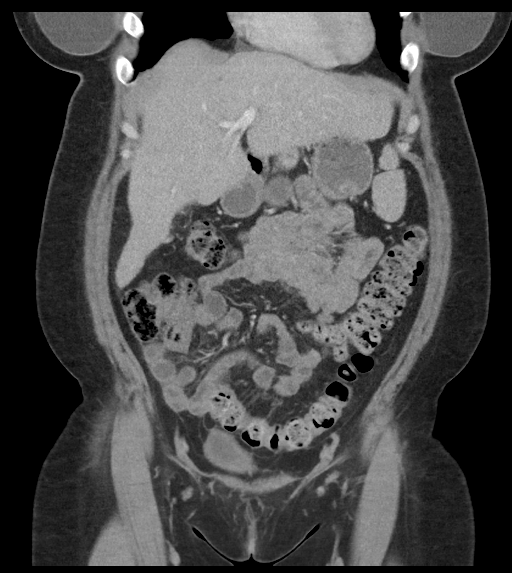
[im 42/94  soft-tissue]
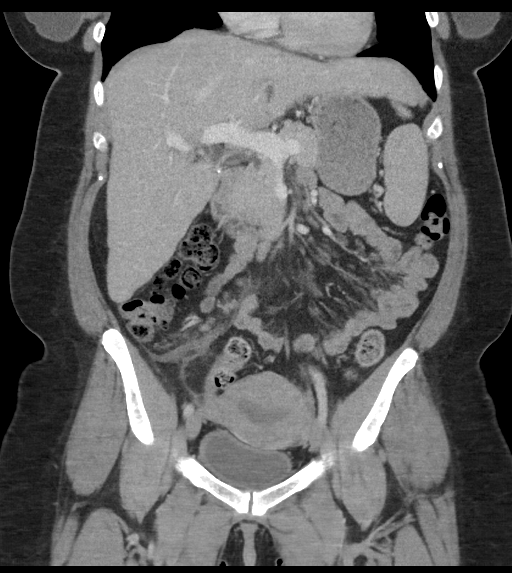
[im 52/94  soft-tissue]
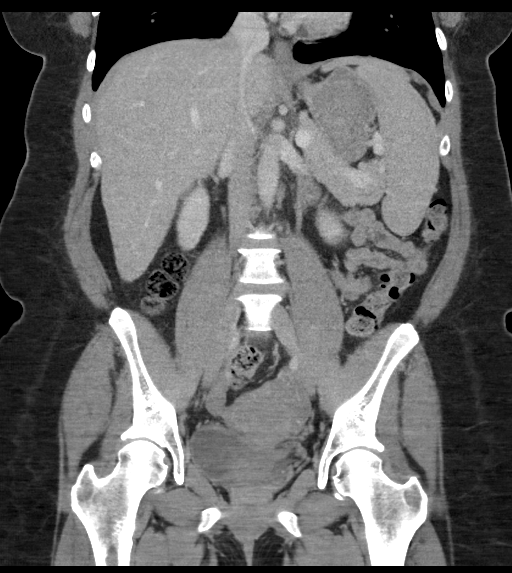

[15 of 46 positions shown; findings below may reference images not displayed]

FINDINGS: Lower chest: The lung bases are clear. Minimal atelectasis is
present. The heart size is normal. No significant pleural or
pericardial effusion is present.

Hepatobiliary: There is some nodularity liver, compatible with known
cirrhosis. 3 subcentimeter hypodense lesions are present. The
largest is near the down, measuring 5 mm. These likely represent
simple cysts. The common bile duct is within normal limits following
cholecystectomy.

Pancreas: Unremarkable. No pancreatic ductal dilatation or
surrounding inflammatory changes.

Spleen: Spleen is somewhat prominent, measuring 12.3 cm. No discrete
lesions are present.

Adrenals/Urinary Tract: Adrenal glands are normal bilaterally. The a
2 cm benign cyst is present posteriorly in the right kidney. Kidneys
and ureters are within normal limits. The urinary bladder is within
normal limits.

Stomach/Bowel: Stomach and duodenum are within normal limits. The
small bowel is unremarkable. The appendix is enlarged with
surrounding inflammatory changes.

Appendix: Location: RetrocecalDiameter: 9.8 mmAppendicolith: Not
presentMucosal hyper-enhancement: PresentExtraluminal gas: Not
presentPeriappendiceal collection: Not present

Laboratory changes surrounding inflamed appendix. There is some free
fluid extending into the anatomic pelvis. The ascending and
transverse colon are within normal limits. The descending and
sigmoid colon are unremarkable.

Vascular/Lymphatic: No significant vascular findings are present. No
enlarged abdominal or pelvic lymph nodes.

Reproductive: The uterus is somewhat edematous. Small fibroids are
noted. Left adnexal follicles are within normal limits for age.

Other: No abdominal wall hernia is present. A small amount of free
fluid is present as described.

Musculoskeletal: A remote T12 superior endplate fracture is present.
There is focal kyphosis. Retropulsed bone is noted along the
fracture plane. Retropulsed bone is worse right than left. The
fracture is worse on the right. Vertebral body heights and alignment
are otherwise maintained.
IMPRESSION: 1. Acute appendicitis as described.
2. No other focal inflammatory changes.
3. Evidence for hepatic cirrhosis including nodular liver and
borderline splenomegaly.
These results were called by telephone at the time of interpretation
on 05/14/2017 at [DATE] to Dr. YANICK SMOOT , who verbally
acknowledged these results.

## 2019-09-15 ENCOUNTER — Ambulatory Visit: Payer: Medicaid Other | Attending: Internal Medicine

## 2024-01-28 ENCOUNTER — Encounter: Payer: Self-pay | Admitting: Advanced Practice Midwife
